# Patient Record
Sex: Female | Born: 1975
Health system: Southern US, Community
[De-identification: ages and names within clinical notes are randomized; demographics above are authoritative.]

## PROBLEM LIST (undated history)

## (undated) ENCOUNTER — Inpatient Hospital Stay (HOSPITAL_COMMUNITY): Payer: Self-pay

## (undated) DIAGNOSIS — O24419 Gestational diabetes mellitus in pregnancy, unspecified control: Secondary | ICD-10-CM

## (undated) DIAGNOSIS — I10 Essential (primary) hypertension: Secondary | ICD-10-CM

## (undated) HISTORY — PX: DILATION AND CURETTAGE OF UTERUS: SHX78

---

## 2002-03-28 ENCOUNTER — Encounter: Payer: Self-pay | Admitting: Emergency Medicine

## 2002-03-28 ENCOUNTER — Emergency Department (HOSPITAL_COMMUNITY): Admission: EM | Admit: 2002-03-28 | Discharge: 2002-03-28 | Payer: Self-pay | Admitting: Emergency Medicine

## 2002-03-29 ENCOUNTER — Emergency Department (HOSPITAL_COMMUNITY): Admission: EM | Admit: 2002-03-29 | Discharge: 2002-03-29 | Payer: Self-pay

## 2002-12-17 ENCOUNTER — Ambulatory Visit (HOSPITAL_COMMUNITY): Admission: RE | Admit: 2002-12-17 | Discharge: 2002-12-17 | Payer: Self-pay | Admitting: Internal Medicine

## 2002-12-17 ENCOUNTER — Encounter: Payer: Self-pay | Admitting: Internal Medicine

## 2003-09-21 ENCOUNTER — Other Ambulatory Visit: Admission: RE | Admit: 2003-09-21 | Discharge: 2003-09-21 | Payer: Self-pay | Admitting: Obstetrics and Gynecology

## 2003-09-22 ENCOUNTER — Inpatient Hospital Stay (HOSPITAL_COMMUNITY): Admission: AD | Admit: 2003-09-22 | Discharge: 2003-09-22 | Payer: Self-pay | Admitting: *Deleted

## 2003-09-24 ENCOUNTER — Encounter (INDEPENDENT_AMBULATORY_CARE_PROVIDER_SITE_OTHER): Payer: Self-pay | Admitting: *Deleted

## 2003-09-24 ENCOUNTER — Ambulatory Visit (HOSPITAL_COMMUNITY): Admission: RE | Admit: 2003-09-24 | Discharge: 2003-09-24 | Payer: Self-pay | Admitting: Obstetrics & Gynecology

## 2003-10-13 ENCOUNTER — Encounter: Admission: RE | Admit: 2003-10-13 | Discharge: 2003-10-13 | Payer: Self-pay | Admitting: Obstetrics & Gynecology

## 2004-04-19 ENCOUNTER — Encounter: Admission: RE | Admit: 2004-04-19 | Discharge: 2004-04-19 | Payer: Self-pay | Admitting: Obstetrics and Gynecology

## 2005-08-22 ENCOUNTER — Inpatient Hospital Stay (HOSPITAL_COMMUNITY): Admission: AD | Admit: 2005-08-22 | Discharge: 2005-08-25 | Payer: Self-pay | Admitting: Obstetrics & Gynecology

## 2007-06-19 ENCOUNTER — Inpatient Hospital Stay (HOSPITAL_COMMUNITY): Admission: AD | Admit: 2007-06-19 | Discharge: 2007-06-22 | Payer: Self-pay | Admitting: Obstetrics

## 2008-01-30 LAB — HEMOGLOBINOPATHY EVALUATION: Hemoglobinopathy Profile: NORMAL

## 2010-11-27 ENCOUNTER — Encounter: Payer: Self-pay | Admitting: Obstetrics

## 2011-03-24 NOTE — Op Note (Signed)
NAME:  Alison Webb, Alison Webb                    ACCOUNT NO.:  [id number]   MEDICAL RECORD NO.:  [id number]                   PATIENT TYPE:  AMB   LOCATION:  SDC                                  FACILITY:  [facility]   PHYSICIAN:  Lesly Dukes, M.D.              DATE OF BIRTH:  [birth year]   DATE OF PROCEDURE:  09/24/2003  DATE OF DISCHARGE:  09/24/2003                                 OPERATIVE REPORT   PREOPERATIVE DIAGNOSIS:  A 35 year old female with a missed abortion at  approximately eight weeks.   POSTOPERATIVE DIAGNOSIS:  A 35 year old female with a missed abortion at  approximately eight weeks.   PROCEDURE:  Dilation and vacuum curettage.   ANESTHESIA:  MAC and local.   SURGEON:  Lesly Dukes, M.D.   ESTIMATED BLOOD LOSS:  50 mL.   COMPLICATIONS:  None.   PATHOLOGY:  Endometrial curettings.   DESCRIPTION OF PROCEDURE:  After informed consent was obtained, the patient  was taken to the operating room, where MAC anesthesia was found to be  adequate.  The patient was in the dorsal lithotomy position and prepared and  draped in the normal sinus rhythm.  The patient's bladder was in-and-out  catheterized and an exam under anesthesia was performed.  No additional  findings were noted.  A weighted speculum was placed in the patient's vagina  and the anterior lip of the cervix was grasped with a single-tooth  tenaculum.  Subsequently 5 mL of 1% lidocaine was injected at 3 and 9  o'clock.  The uterus was then sounded to 11 cm.  The cervix was then gently  dilated with the Hegar dilators to a #8.  A #8 curved suction curette was  gently introduced into the uterus and a gentle suction curettage was  performed.  The suction curette was removed and gentle sharp curettage was  performed.  One final pass with the suction curette was then performed.  Good hemostasis was noted from the cervical os and the tenaculum site.  The  patient tolerated the procedure well.  Sponge, lap,  instrument, and needle  count were correct x2 and the patient went to the recovery room in stable  condition.                                               Lesly Dukes, M.D.    Lora Paula  D:  09/28/2003  T:  09/29/2003  Job:  [id number]

## 2011-03-24 NOTE — Group Therapy Note (Signed)
NAME:  Alison Webb, Alison Webb NO.:  [id number]   MEDICAL RECORD NO.:  [id number]                   PATIENT TYPE:  OUT   LOCATION:  [facility]                           FACILITY:  [facility]   PHYSICIAN:  Argentina Donovan, MD                     DATE OF BIRTH:  Dec 30, 1975   DATE OF SERVICE:                                    CLINIC NOTE   This is a 35 year old, gravida 1, para 0-0-1-0, who had a spontaneous  abortion and D&C in November 2004.  She has always had a history of  irregular periods and has, for the last six months, had only a couple of  periods of where she just spotted.  The physical examination is normal and  Pap smear was done.   She probably has polycystic ovarian syndrome.  We will order an FSH, LH,  TSH, hemoglobin A1c and a prolactin level and also a beta HCG.  If all of  these are normal, I would try the patient on Glucophage 500 t.i.d. and see  if we could not get her ovulating regularly and perhaps pregnant.   IMPRESSION:  __________ menorrhea, probable polycystic ovarian syndrome.                                               Argentina Donovan, MD    PR/MEDQ  D:  04/19/2004  T:  04/20/2004  Job:  [id number]

## 2011-08-18 LAB — CCBB MATERNAL DONOR DRAW

## 2011-08-18 LAB — CBC
HCT: 33.1 — ABNORMAL LOW
Hemoglobin: 11.6 — ABNORMAL LOW
MCHC: 35.1
MCV: 91.3
Platelets: 153
RBC: 3.62 — ABNORMAL LOW
RDW: 15.1 — ABNORMAL HIGH
WBC: 8.2

## 2011-08-21 LAB — CBC
MCHC: 34.2
Platelets: 183
RBC: 4.17
WBC: 6.4

## 2011-08-21 LAB — RPR: RPR Ser Ql: NONREACTIVE

## 2013-01-06 LAB — CYTOLOGY - PAP: Pap: NEGATIVE

## 2013-09-04 ENCOUNTER — Ambulatory Visit: Payer: Self-pay | Admitting: Family Medicine

## 2013-09-04 VITALS — BP 120/86 | HR 86 | Temp 99.2°F | Resp 18 | Ht 60.25 in | Wt 165.4 lb

## 2013-09-04 DIAGNOSIS — R1032 Left lower quadrant pain: Secondary | ICD-10-CM

## 2013-09-04 DIAGNOSIS — N921 Excessive and frequent menstruation with irregular cycle: Secondary | ICD-10-CM

## 2013-09-04 LAB — POCT UA - MICROSCOPIC ONLY
Casts, Ur, LPF, POC: NEGATIVE
Crystals, Ur, HPF, POC: NEGATIVE
Mucus, UA: NEGATIVE
Yeast, UA: NEGATIVE

## 2013-09-04 LAB — POCT WET PREP WITH KOH
Clue Cells Wet Prep HPF POC: NEGATIVE
KOH Prep POC: NEGATIVE
Trichomonas, UA: NEGATIVE
Yeast Wet Prep HPF POC: NEGATIVE

## 2013-09-04 LAB — POCT URINALYSIS DIPSTICK
Bilirubin, UA: NEGATIVE
Glucose, UA: NEGATIVE
Ketones, UA: NEGATIVE
Leukocytes, UA: NEGATIVE
Nitrite, UA: NEGATIVE
Protein, UA: NEGATIVE
Spec Grav, UA: 1.015
Urobilinogen, UA: 0.2
pH, UA: 7

## 2013-09-04 LAB — POCT URINE PREGNANCY: Preg Test, Ur: NEGATIVE

## 2013-09-04 NOTE — Patient Instructions (Signed)
 Metrorragia  (Metrorrhagia)  La metrorragia es el sangrado que proviene del tero. Aparece en forma inesperada, como por ejemplo entre los [employer]. El sangrado puede durar [place]. CUIDADOS EN EL HOGAR   Tome los medicamentos como  indic el mdico. No modifique los medicamentos sin consultarlo con el mdico.  Tome los comprimidos de hierro (supmentos) exactamente como  indic el mdico. Si tiene dificultad para ir de cuerpo (mover el intestino),consuma ms frutas y verduras.  No tome aspirina antes o durante el perodo menstrual. No tome medicamentos que contengan aspirina. Contro las etiquetas.  Descanse todo lo que pueda si debe cambiar el apsito o el tampn ms de una vez cada 2 horas.  Consuma alimentos que contengan hierro. Estos alimentos son:  [employer].  Carnes rojas.  Hgado.  Huevos.  Panes y cereas integras.  No trate de perder peso hasta que su mdico la autorice. SOLICITE AYUDA DE INMEDIATO SI:   Tiene fiebre.  Siente escalofros.  Se siente dbil, tiene mareos o se desvanece (se desmaya).  Debe cambiar el apsito o el tampn ms de una vez por hora.  Tiene un sangrado abundante.  Elimina cogulos de sangre por la vagina.  Siente Programme researcher, broadcasting/film/video (nuseas) y vmitos.  No puede retener los alimentos.  Se siente mareada o tiene heces acuosas (diarrea) mientras toma los medicamentos.  Tiene probmas que usted cree que estn relacionados con los medicamentos. ASEGRESE DE QUE:   Comprende estas instrucciones.  Controlar su enfermedad.  Solicitar ayuda de inmediato si no mejora o si empeora. Document Reased: 04/23/2012 [facility] Patient Information 2014 [place], [place]. Metrorrhagia Metrorrhagia is beding from your uterus. It happens at times when you are not expecting it, like between periods. The beding may also last a long time. HOME CARE   Take all medicine as told by your doctor. Do not  change or switch medicines without talking to your doctor first.  Take iron pills (suppments) as told by your doctor. If you start to have troub pooping (bowel movement), eat more fruits and vegetabs.  Do not take aspirin before or during your period. Do not take medicines that have aspirin in them. Check the label.  Rest as much as you can if you change a pad or tampon more than once in 2 hours.  Eat meals that include foods that have iron in them. These foods are:  Green afy vegetabs.  Red meat.  Liver.  Eggs.  Who-grain breads and cereals.  Do not try to lose weight until your doctor says it is okay. GET HELP RIGHT AWAY IF:  You have a fever.  You have chills.  You feel lightheaded or pass out (faint).  You need to change your pad or tampon more than once in 1 hour.  Your beding is heavy.  You pass clumps of blood (clots) or tissue from your vagina.  You feel sick to your stomach (nauseous) and throw up (vomit).  You cannot keep foods down.  You feel dizzy or have watery poop (diarrhea) whi taking medicine.  You have probms that you think are caused by your medicines. MAKE SURE YOU:   Understand these instructions.  Will watch your condition.  Will get help right away if you are not doing well or get worse. Document Reased: 01/15/2012 Document Reviewed: 01/15/2012 [facility] Patient Information 2014 [place], [place].

## 2013-09-04 NOTE — Progress Notes (Signed)
[facility]:  Office Visit  Chief Complaint:  Chief Complaint  Patient presents with  . Abdominal Pain    pt states her menstrual cycle has came on 2 x in one month, feeling some discomfort in lower left abdominal pelvic region  . Back Pain    HPI: Alison Webb is a 37 y.o. female, pt states she had a menstrual period  around the 16th of October, and started bleeding again on Monday. Eac time lasted for 4-5 days. When the bleeding started on Monday it was heavy and then tapered off to spotting after a day or 2. Pt reports that it stopped today.She started birth control pills 2 years ago. NO new changes to OCP.  She is taking birth control, and states her periods have always been very regular. She is having low back pain, she states it is constant and a sharp/achey pain. She is also having LLQ pain that comes and goes. She is unsure whether she should continue with her birth control.  She states that the first 2 days was heavy like her normal period and then she had spotting.  She missed taking her OCP on Saturday and Sunday 2 pills and started taking her pills again Monday night She had sex during the 2 days that she missed the pills without protection She started her period on Monday and today she has no spotting [facility] for [facility]gyn care-- Last pap was in May 2014 was normal No fevers chills nausea dizziness fatigue Mom had a hysterectomy due to uterine tumor ( benign) sounds like fibroids.  No urinary sxs, no STD, no vaginal dc, no ovarian cysts  History reviewed. No pertinent past medical history. History reviewed. No pertinent past surgical history. History   Social History  . Marital Status: Married    Spouse Name: N/A    Number of Children: N/A  . Years of Education: N/A   Social History Main Topics  . Smoking status: Never Smoker   . Smokeless tobacco: None  . Alcohol Use: No  . Drug Use: No  . Sexual Activity: None   Other Topics Concern   . None   Social History Narrative  . None   Family History  Problem Relation Age of Onset  . Cancer Mother   . Hyperlipidemia Mother    No Known Allergies Prior to Admission medications   Not on File     ROS: The patient denies fevers, chills, night sweats, unintentional weight loss, chest pain, palpitations, wheezing, dyspnea on exertion, nausea, vomiting,dysuria, hematuria, melena, numbness, weakness, or tingling.  All other systems have been reviewed and were otherwise negative with the exception of those mentioned in the HPI and as above.    PHYSICAL EXAM: Filed Vitals:   09/04/13 1642  BP: 120/86  Pulse: 86  Temp: 99.2 F (37.3 C)  Resp: 18   Filed Vitals:   09/04/13 1642  Height: 5' 0.25" (1.53 m)  Weight: 165 lb 6.4 oz (75.025 kg)   Body mass index is 32.05 kg/(m^2).  General: Alert, no acute distress HEENT:  Normocephalic, atraumatic, oropharynx patent. EOMI, PERRLA Cardiovascular:  Regular rate and rhythm, no rubs murmurs or gallops.  No Carotid bruits, radial pulse intact. No pedal edema.  Respiratory: Clear to auscultation bilaterally.  No wheezes, rales, or rhonchi.  No cyanosis, no use of accessory musculature GI: No organomegaly, abdomen is soft and non-tender, positive bowel sounds.  No masses. Skin: No rashes. Neurologic: Facial musculature symmetric.  Psychiatric: Patient is appropriate throughout our interaction. Lymphatic: No cervical lymphadenopathy Musculoskeletal: Gait intact. GU normal, no bleeding, cervix nl, no CMT, no dc, no CVA tenderness, no adnexal tenderness   LABS: Results for orders placed in visit on 09/04/13  POCT UA - MICROSCOPIC ONLY      Result Value Range   WBC, Ur, HPF, POC 0-2     RBC, urine, microscopic 4-6     Bacteria, U Microscopic small     Mucus, UA neg     Epithelial cells, urine per micros 0-2     Crystals, Ur, HPF, POC neg     Casts, Ur, LPF, POC neg     Yeast, UA neg    POCT URINALYSIS DIPSTICK       Result Value Range   Color, UA yellow     Clarity, UA clear     Glucose, UA neg     Bilirubin, UA neg     Ketones, UA neg     Spec Grav, UA 1.015     Blood, UA small     pH, UA 7.0     Protein, UA neg     Urobilinogen, UA 0.2     Nitrite, UA neg     Leukocytes, UA Negative    POCT URINE PREGNANCY      Result Value Range   Preg Test, Ur Negative    POCT WET PREP WITH KOH      Result Value Range   Trichomonas, UA Negative     Clue Cells Wet Prep HPF POC neg     Epithelial Wet Prep HPF POC 3-6     Yeast Wet Prep HPF POC neg     Bacteria Wet Prep HPF POC 1+     RBC Wet Prep HPF POC 0-2     WBC Wet Prep HPF POC 3-5     KOH Prep POC Negative       EKG/XRAY:   Primary read interpreted by Dr. Conley Rolls at [facility].   ASSESSMENT/PLAN: Encounter Diagnoses  Name Primary?  . Metrorrhagia Yes  . Abdominal pain, left lower quadrant    Most likely related to 2 missed dose s of OCP Cont with OCP and we will see if she has recurrent abnormal uterine bleed, if so then will refer to ob/gyn or get a pelvic US F/u prn  Gross sideeffects, risk and benefits, and alternatives of medications d/w patient. Patient is aware that all medications have potential sideeffects and we are unable to predict every sideeffect or drug-drug interaction that may occur.  Hamilton Capri PHUONG, DO 09/04/2013 5:43 PM

## 2013-12-10 ENCOUNTER — Ambulatory Visit: Payer: Self-pay | Admitting: Physician Assistant

## 2013-12-10 VITALS — BP 146/84 | HR 106 | Temp 99.0°F | Resp 16 | Ht 60.5 in | Wt 169.0 lb

## 2013-12-10 DIAGNOSIS — N898 Other specified noninflammatory disorders of vagina: Secondary | ICD-10-CM

## 2013-12-10 DIAGNOSIS — R350 Frequency of micturition: Secondary | ICD-10-CM

## 2013-12-10 DIAGNOSIS — R300 Dysuria: Secondary | ICD-10-CM

## 2013-12-10 LAB — POCT URINALYSIS DIPSTICK
Bilirubin, UA: NEGATIVE
Glucose, UA: NEGATIVE
Ketones, UA: NEGATIVE
Leukocytes, UA: NEGATIVE
Nitrite, UA: NEGATIVE
Protein, UA: NEGATIVE
Spec Grav, UA: 1.02
Urobilinogen, UA: 0.2
pH, UA: 7

## 2013-12-10 LAB — POCT UA - MICROSCOPIC ONLY
Casts, Ur, LPF, POC: NEGATIVE
Crystals, Ur, HPF, POC: NEGATIVE
Mucus, UA: NEGATIVE
Yeast, UA: NEGATIVE

## 2013-12-10 LAB — POCT WET PREP WITH KOH
Clue Cells Wet Prep HPF POC: NEGATIVE
KOH Prep POC: NEGATIVE
RBC Wet Prep HPF POC: NEGATIVE
Trichomonas, UA: NEGATIVE
Yeast Wet Prep HPF POC: NEGATIVE

## 2013-12-11 NOTE — Progress Notes (Signed)
Subjective:    Patient ID: Alison Webb, female    DOB: [birth year], 38 y.o.   MRN: [id number]  HPI 38 year old female presents for evaluation of dysuria and urinary frequency x 3 weeks.  Also complains of some vaginal pruritis that seems to be getting worse.  She is pregnant - LNMP 09/16/13. [facility] and will schedule 1st OB appointment in the next few days.  Denies any vaginal discharge, bleeding/spotting, abdominal pain, nausea, vomiting, flank pain, fever, or chills.  She is currently on prenatal vitamins. No other medications.  Patient is otherwise doing well with no other concerns today.     Review of Systems  Constitutional: Negative for fever and chills.  Gastrointestinal: Negative for nausea and vomiting.  Genitourinary: Positive for dysuria and frequency. Negative for flank pain and vaginal discharge.  Neurological: Negative for dizziness and headaches.       Objective:   Physical Exam  Constitutional: She is oriented to person, place, and time. She appears well-developed and well-nourished.  HENT:  Head: Normocephalic and atraumatic.  Right Ear: External ear normal.  Left Ear: External ear normal.  Mouth/Throat: Oropharynx is clear and moist.  Eyes: Conjunctivae are normal.  Neck: Normal range of motion.  Cardiovascular: Normal rate, regular rhythm and normal heart sounds.   Pulmonary/Chest: Effort normal and breath sounds normal.  Abdominal: Soft. Bowel sounds are normal. There is no tenderness. There is no rebound and no guarding.  Genitourinary: Vagina normal and uterus normal. Pelvic exam was performed with patient supine. Cervix exhibits no motion tenderness, no discharge and no friability.  Neurological: She is alert and oriented to person, place, and time.  Psychiatric: She has a normal mood and affect. Her behavior is normal. Judgment and thought content normal.      Results for orders placed in visit on 12/10/13  POCT  URINALYSIS DIPSTICK      Result Value Range   Color, UA yellow     Clarity, UA cloudy     Glucose, UA neg     Bilirubin, UA neg     Ketones, UA neg     Spec Grav, UA 1.020     Blood, UA trace     pH, UA 7.0     Protein, UA neg     Urobilinogen, UA 0.2     Nitrite, UA neg     Leukocytes, UA Negative    POCT UA - MICROSCOPIC ONLY      Result Value Range   WBC, Ur, HPF, POC 0-2     RBC, urine, microscopic 0-2     Bacteria, U Microscopic small     Mucus, UA neg     Epithelial cells, urine per micros 0-2     Crystals, Ur, HPF, POC neg     Casts, Ur, LPF, POC neg     Yeast, UA neg    POCT WET PREP WITH KOH      Result Value Range   Trichomonas, UA Negative     Clue Cells Wet Prep HPF POC neg     Epithelial Wet Prep HPF POC 5-8     Yeast Wet Prep HPF POC neg     Bacteria Wet Prep HPF POC 1+     RBC Wet Prep HPF POC neg     WBC Wet Prep HPF POC 6-10     KOH Prep POC Negative         Assessment & Plan:  Dysuria - Plan: POCT urinalysis dipstick, POCT UA - Microscopic Only, Urine culture  Urinary frequency  Leukorrhea, not specified as infective - Plan: POCT Wet Prep with KOH  Will send urine culture.  UA and wet prep WNL so will reserve treatment for if culture shows growth.   Make OB appointment as soon as possible  Continue prenatal vitamins RTC precautions discussed. [facility]

## 2013-12-12 LAB — URINE CULTURE
Colony Count: NO GROWTH
Organism ID, Bacteria: NO GROWTH

## 2014-01-08 ENCOUNTER — Other Ambulatory Visit (HOSPITAL_COMMUNITY): Payer: Self-pay | Admitting: Nurse Practitioner

## 2014-01-08 ENCOUNTER — Ambulatory Visit (HOSPITAL_COMMUNITY)
Admission: RE | Admit: 2014-01-08 | Discharge: 2014-01-08 | Disposition: A | Discharge status: Home / Self Care | Payer: Medicaid Other | Source: Ambulatory Visit | Attending: Nurse Practitioner | Admitting: Nurse Practitioner

## 2014-01-08 DIAGNOSIS — O3680X0 Pregnancy with inconclusive fetal viability, not applicable or unspecified: Secondary | ICD-10-CM

## 2014-01-08 DIAGNOSIS — O368390 Maternal care for abnormalities of the fetal heart rate or rhythm, unspecified trimester, not applicable or unspecified: Secondary | ICD-10-CM | POA: Insufficient documentation

## 2014-01-08 DIAGNOSIS — IMO0002 Reserved for concepts with insufficient information to code with codable children: Secondary | ICD-10-CM

## 2014-01-08 LAB — OB RESULTS CONSOLE HIV ANTIBODY (ROUTINE TESTING): HIV: NONREACTIVE

## 2014-01-08 LAB — OB RESULTS CONSOLE RUBELLA ANTIBODY, IGM: RUBELLA: IMMUNE

## 2014-01-08 LAB — OB RESULTS CONSOLE ANTIBODY SCREEN: Antibody Screen: NEGATIVE

## 2014-01-08 LAB — OB RESULTS CONSOLE RPR: RPR: NONREACTIVE

## 2014-01-08 LAB — OB RESULTS CONSOLE HGB/HCT, BLOOD
HCT: 41 %
HEMOGLOBIN: 13.5 g/dL

## 2014-01-08 LAB — OB RESULTS CONSOLE PLATELET COUNT: Platelets: 181 10*3/uL

## 2014-01-08 LAB — OB RESULTS CONSOLE HEPATITIS B SURFACE ANTIGEN: Hepatitis B Surface Ag: NEGATIVE

## 2014-01-08 LAB — OB RESULTS CONSOLE ABO/RH: RH TYPE: POSITIVE

## 2014-01-08 LAB — GLUCOSE TOLERANCE, 1 HOUR: Glucose, 1 Hour GTT: 215

## 2014-01-08 LAB — CULTURE, OB URINE: URINE CULTURE, OB: NEGATIVE

## 2014-01-08 LAB — OB RESULTS CONSOLE VARICELLA ZOSTER ANTIBODY, IGG: VARICELLA IGG: IMMUNE

## 2014-01-09 ENCOUNTER — Encounter: Payer: Self-pay | Admitting: *Deleted

## 2014-01-12 ENCOUNTER — Encounter: Payer: Self-pay | Admitting: General Practice

## 2014-01-14 ENCOUNTER — Encounter: Payer: Self-pay | Admitting: General Practice

## 2014-01-21 ENCOUNTER — Encounter: Payer: Self-pay | Admitting: *Deleted

## 2014-01-26 ENCOUNTER — Encounter: Payer: Self-pay | Admitting: Obstetrics and Gynecology

## 2014-01-26 ENCOUNTER — Encounter: Payer: Self-pay | Admitting: Family Medicine

## 2014-01-29 LAB — OB RESULTS CONSOLE HEPATITIS B SURFACE ANTIGEN: HEP B S AG: NEGATIVE

## 2014-01-29 LAB — OB RESULTS CONSOLE ABO/RH: RH Type: POSITIVE

## 2014-01-29 LAB — OB RESULTS CONSOLE ANTIBODY SCREEN: Antibody Screen: NEGATIVE

## 2014-01-29 LAB — OB RESULTS CONSOLE GC/CHLAMYDIA
Chlamydia: NEGATIVE
GC PROBE AMP, GENITAL: NEGATIVE

## 2014-01-29 LAB — OB RESULTS CONSOLE RPR: RPR: NONREACTIVE

## 2014-01-29 LAB — OB RESULTS CONSOLE HIV ANTIBODY (ROUTINE TESTING): HIV: NONREACTIVE

## 2014-01-29 LAB — OB RESULTS CONSOLE RUBELLA ANTIBODY, IGM: Rubella: IMMUNE

## 2014-01-30 LAB — PROCEDURE REPORT - SCANNED: PAP SMEAR: NEGATIVE

## 2014-03-26 ENCOUNTER — Encounter: Payer: Self-pay | Admitting: General Practice

## 2014-04-10 ENCOUNTER — Other Ambulatory Visit (HOSPITAL_COMMUNITY): Payer: Self-pay | Admitting: Obstetrics

## 2014-04-10 DIAGNOSIS — O24919 Unspecified diabetes mellitus in pregnancy, unspecified trimester: Secondary | ICD-10-CM

## 2014-04-10 DIAGNOSIS — Z0489 Encounter for examination and observation for other specified reasons: Secondary | ICD-10-CM

## 2014-04-10 DIAGNOSIS — O09529 Supervision of elderly multigravida, unspecified trimester: Secondary | ICD-10-CM

## 2014-04-10 DIAGNOSIS — IMO0002 Reserved for concepts with insufficient information to code with codable children: Secondary | ICD-10-CM

## 2014-04-21 ENCOUNTER — Encounter: Payer: Self-pay | Admitting: General Practice

## 2014-04-30 ENCOUNTER — Ambulatory Visit (HOSPITAL_COMMUNITY)
Admission: RE | Admit: 2014-04-30 | Discharge: 2014-04-30 | Disposition: A | Discharge status: Home / Self Care | Payer: Self-pay | Source: Ambulatory Visit | Attending: Obstetrics | Admitting: Obstetrics

## 2014-04-30 ENCOUNTER — Encounter: Payer: Medicaid Other | Attending: Obstetrics | Admitting: *Deleted

## 2014-04-30 ENCOUNTER — Encounter (HOSPITAL_COMMUNITY): Payer: Self-pay

## 2014-04-30 DIAGNOSIS — O24919 Unspecified diabetes mellitus in pregnancy, unspecified trimester: Secondary | ICD-10-CM

## 2014-04-30 DIAGNOSIS — O09529 Supervision of elderly multigravida, unspecified trimester: Secondary | ICD-10-CM | POA: Insufficient documentation

## 2014-04-30 DIAGNOSIS — IMO0002 Reserved for concepts with insufficient information to code with codable children: Secondary | ICD-10-CM

## 2014-04-30 DIAGNOSIS — O99810 Abnormal glucose complicating pregnancy: Secondary | ICD-10-CM | POA: Insufficient documentation

## 2014-04-30 DIAGNOSIS — Z363 Encounter for antenatal screening for malformations: Secondary | ICD-10-CM | POA: Insufficient documentation

## 2014-04-30 DIAGNOSIS — Z1389 Encounter for screening for other disorder: Secondary | ICD-10-CM | POA: Insufficient documentation

## 2014-04-30 DIAGNOSIS — Z0489 Encounter for examination and observation for other specified reasons: Secondary | ICD-10-CM

## 2014-04-30 DIAGNOSIS — Z713 Dietary counseling and surveillance: Secondary | ICD-10-CM | POA: Insufficient documentation

## 2014-04-30 NOTE — Progress Notes (Signed)
  Patient was seen on 04/30/14 for Gestational Diabetes self-management . The following learning objectives were met by the patient :   States the definition of Gestational Diabetes  States why dietary management is important in controlling blood glucose  Describes the effects of carbohydrates on blood glucose levels  Demonstrates ability to create a balanced meal plan  Demonstrates carbohydrate counting   States when to check blood glucose levels  Demonstrates proper blood glucose monitoring techniques  States the effect of stress and exercise on blood glucose levels  States the importance of limiting caffeine and abstaining from alcohol and smoking  Plan:  Aim for 2 Carb Choices per meal (30 grams) +/- 1 either way for breakfast Aim for 3 Carb Choices per meal (45 grams) +/- 1 either way from lunch and dinner Aim for 1-2 Carbs per snack Begin reading food labels for Total Carbohydrate and sugar grams of foods Consider  increasing your activity level by walking daily as tolerated Begin checking BG before breakfast and 2 hours after first bit of breakfast, lunch and dinner after  as directed by MD  Take medication  as directed by MD  Blood glucose monitor given: Advised patient to obtain Reli-On glucometer, test strips and lancets  Patient instructed to monitor glucose levels: FBS: 60 - <90 2 hour: <120  Patient received the following handouts:  Carbohydrate Counting List  My Plate  Patient will be seen for follow-up as needed.

## 2014-06-13 ENCOUNTER — Observation Stay (HOSPITAL_COMMUNITY)
Admission: AD | Admit: 2014-06-13 | Discharge: 2014-06-14 | Disposition: A | Discharge status: Home / Self Care | Payer: Medicaid Other | Source: Ambulatory Visit | Attending: Obstetrics & Gynecology | Admitting: Obstetrics & Gynecology

## 2014-06-13 ENCOUNTER — Encounter (HOSPITAL_COMMUNITY): Payer: Self-pay | Admitting: *Deleted

## 2014-06-13 ENCOUNTER — Inpatient Hospital Stay (HOSPITAL_COMMUNITY): Payer: Medicaid Other

## 2014-06-13 DIAGNOSIS — Z79899 Other long term (current) drug therapy: Secondary | ICD-10-CM | POA: Insufficient documentation

## 2014-06-13 DIAGNOSIS — O209 Hemorrhage in early pregnancy, unspecified: Principal | ICD-10-CM | POA: Insufficient documentation

## 2014-06-13 DIAGNOSIS — O4693 Antepartum hemorrhage, unspecified, third trimester: Secondary | ICD-10-CM | POA: Diagnosis present

## 2014-06-13 DIAGNOSIS — Z8632 Personal history of gestational diabetes: Secondary | ICD-10-CM | POA: Insufficient documentation

## 2014-06-13 HISTORY — DX: Gestational diabetes mellitus in pregnancy, unspecified control: O24.419

## 2014-06-13 LAB — CBC
HCT: 38.3 % (ref 36.0–46.0)
HEMOGLOBIN: 13.3 g/dL (ref 12.0–15.0)
MCH: 32 pg (ref 26.0–34.0)
MCHC: 34.7 g/dL (ref 30.0–36.0)
MCV: 92.3 fL (ref 78.0–100.0)
Platelets: 169 10*3/uL (ref 150–400)
RBC: 4.15 MIL/uL (ref 3.87–5.11)
RDW: 14.8 % (ref 11.5–15.5)
WBC: 7.6 10*3/uL (ref 4.0–10.5)

## 2014-06-13 LAB — FIBRINOGEN: Fibrinogen: 539 mg/dL — ABNORMAL HIGH (ref 204–475)

## 2014-06-13 LAB — URINALYSIS, ROUTINE W REFLEX MICROSCOPIC
Bilirubin Urine: NEGATIVE
GLUCOSE, UA: NEGATIVE mg/dL
Ketones, ur: 40 mg/dL — AB
Leukocytes, UA: NEGATIVE
Nitrite: NEGATIVE
PH: 6.5 (ref 5.0–8.0)
PROTEIN: NEGATIVE mg/dL
SPECIFIC GRAVITY, URINE: 1.01 (ref 1.005–1.030)
Urobilinogen, UA: 0.2 mg/dL (ref 0.0–1.0)

## 2014-06-13 LAB — ABO/RH: ABO/RH(D): O POS

## 2014-06-13 LAB — TYPE AND SCREEN
ABO/RH(D): O POS
Antibody Screen: NEGATIVE

## 2014-06-13 LAB — GLUCOSE, CAPILLARY
GLUCOSE-CAPILLARY: 113 mg/dL — AB (ref 70–99)
Glucose-Capillary: 117 mg/dL — ABNORMAL HIGH (ref 70–99)

## 2014-06-13 LAB — URINE MICROSCOPIC-ADD ON

## 2014-06-13 MED ORDER — ZOLPIDEM TARTRATE 5 MG PO TABS: 5.0000 mg | ORAL_TABLET | Freq: Every evening | ORAL | Status: DC | PRN

## 2014-06-13 MED ORDER — ACETAMINOPHEN 325 MG PO TABS: 650.0000 mg | ORAL_TABLET | ORAL | Status: DC | PRN

## 2014-06-13 MED ORDER — PRENATAL MULTIVITAMIN CH
1.0000 | ORAL_TABLET | Freq: Every day | ORAL | Status: DC
  Administered 2014-06-13 – 2014-06-14 (×2): 1 via ORAL
  Filled 2014-06-13 (×4): qty 1

## 2014-06-13 MED ORDER — CALCIUM CARBONATE ANTACID 500 MG PO CHEW
2.0000 | CHEWABLE_TABLET | ORAL | Status: DC | PRN
  Filled 2014-06-13: qty 2

## 2014-06-13 MED ORDER — DOCUSATE SODIUM 100 MG PO CAPS
100.0000 mg | ORAL_CAPSULE | Freq: Every day | ORAL | Status: DC
  Filled 2014-06-13 (×4): qty 1

## 2014-06-13 NOTE — H&P (Signed)
Chief Complaint   Patient presents with   .  Vaginal Bleeding    HPI  Alison Webb 38 y.o. 4639w3d Awakened today with blood in her underwear. Was concerned so came to MAU. Denies any contractions other than CSX CorporationBraxton Hicks. In no pain at present.  OB History    Grav  Para  Term  Preterm  Abortions  TAB  SAB  Ect  Mult  Living    4  2  2   0  1  0  1  0  0  2      Past Medical History   Diagnosis  Date   .  Gestational diabetes     Past Surgical History   Procedure  Laterality  Date   .  Dilation and curettage of uterus       2004    Family History   Problem  Relation  Age of Onset   .  Cancer  Mother    .  Hyperlipidemia  Mother     History   Substance Use Topics   .  Smoking status:  Never Smoker   .  Smokeless tobacco:  Not on file   .  Alcohol Use:  No    Allergies: No Known Allergies  Prescriptions prior to admission   Medication  Sig  Dispense  Refill   .  Prenatal Vit-Fe Fumarate-FA (PRENATAL MULTIVITAMIN) TABS tablet  Take 1 tablet by mouth daily at 12 noon.      Review of Systems  Constitutional: Negative for fever.  Gastrointestinal: Negative for nausea, vomiting and abdominal pain.  Genitourinary:  No vaginal discharge.  Vaginal bleeding.  No dysuria.   Physical Exam   Blood pressure 111/59, pulse 76, temperature 98.1 F (36.7 C), temperature source Oral, resp. rate 18, height 5\' 4"  (1.626 m), weight 154 lb 4 oz (69.967 kg), last menstrual period 09/16/2013.  Physical Exam  Nursing note and vitals reviewed.  Constitutional: She is oriented to person, place, and time. She appears well-developed and well-nourished.  HENT:  Head: Normocephalic.  Eyes: EOM are normal.  Neck: Neck supple.  GI: Soft. There is no tenderness.  Genitourinary:  Speculum exam: Vagina - Small amount of thick dark blood Cervix - difficult to visualize Bimanual exam: Cervix int os closed, external os FT, thick and firm Uterus non tender, gravid Chaperone present for  exam.  Musculoskeletal: Normal range of motion.  Neurological: She is alert and oriented to person, place, and time.  Skin: Skin is warm and dry.  Psychiatric: She has a normal mood and affect.  Gc/Chlam done   MDM  Assessment and Plan   Bleeding in third trimester--ddx cervicitis, threatened PTL, marginal abruption IUP @ 7939w3d; RH positive Stable at present Category I FHT   Plan  Admit for observation Check CBC, fibrinogen U/S for growth Pad count

## 2014-06-13 NOTE — MAU Note (Signed)
Pt states here for light pink spotting in panties when she woke up, then saw more light pink blood when wiping post voiding. Denies pain at present.

## 2014-06-13 NOTE — MAU Provider Note (Signed)
  History     CSN: [id number]  Arrival date and time: 06/13/14 [id number]   First Provider Initiated Contact with Patient 06/13/14 1049      Chief Complaint  Patient presents with  . Vaginal Bleeding   HPI Alison Webb 38 y.o. 737w3d   Awakened today with blood in her underwear.  Was concerned so came to MAU.  Denies any contractions other than [employer] CorporationBraxton Hicks.  In no pain at present.  OB History   Grav Para Term Preterm Abortions TAB SAB Ect Mult Living   4 2 2  0 1 0 1 0 0 2      Past Medical History  Diagnosis Date  . Gestational diabetes     Past Surgical History  Procedure Laterality Date  . Dilation and curettage of uterus      2004    Family History  Problem Relation Age of Onset  . Cancer Mother   . Hyperlipidemia Mother     History  Substance Use Topics  . Smoking status: Never Smoker   . Smokeless tobacco: Not on file  . Alcohol Use: No    Allergies: No Known Allergies  Prescriptions prior to admission  Medication Sig Dispense Refill  . Prenatal Vit-Fe Fumarate-FA (PRENATAL MULTIVITAMIN) TABS tablet Take 1 tablet by mouth daily at 12 noon.        Review of Systems  Constitutional: Negative for fever.  Gastrointestinal: Negative for nausea, vomiting and abdominal pain.  Genitourinary:       No vaginal discharge. Vaginal bleeding. No dysuria.   Physical Exam   Blood pressure 111/59, pulse 76, temperature 98.1 F (36.7 C), temperature source Oral, resp. rate 18, height 5\' 4"  (1.626 m), weight 154 lb 4 oz (69.967 kg), last menstrual period 09/16/2013.  Physical Exam  Nursing note and vitals reviewed. Constitutional: She is oriented to person, place, and time. She appears well-developed and well-nourished.  HENT:  Head: Normocephalic.  Eyes: EOM are normal.  Neck: Neck supple.  GI: Soft. There is no tenderness.  Genitourinary:  Speculum exam: Vagina - Small amount of thick dark blood Cervix  - difficult to visualize Bimanual  exam: Cervix int os closed, external os FT, thick and firm Uterus non tender, gravid Chaperone present for exam.  Musculoskeletal: Normal range of motion.  Neurological: She is alert and oriented to person, place, and time.  Skin: Skin is warm and dry.  Psychiatric: She has a normal mood and affect.  Gc/Chlam done  MAU Course  Procedures Ultrasound preliminary report reviewed. Consult with Dr. Tamela OddiJackson-Moore - orders received   MDM   Assessment and Plan  Bleeding in pregnancy at 34 week  Plan Admit for observation  BURLESON,TERRI 06/13/2014, 11:04 AM

## 2014-06-14 DIAGNOSIS — O4693 Antepartum hemorrhage, unspecified, third trimester: Secondary | ICD-10-CM | POA: Diagnosis present

## 2014-06-14 LAB — GLUCOSE, CAPILLARY
Glucose-Capillary: 69 mg/dL — ABNORMAL LOW (ref 70–99)
Glucose-Capillary: 121 mg/dL — ABNORMAL HIGH (ref 70–99)

## 2014-06-14 NOTE — Discharge Instructions (Signed)
Hemorragia vaginal durante el embarazo (tercer trimestre) (Vaginal Bleeding During Pregnancy, Third Trimester)  Durante el embarazo, es comn tener una pequea hemorragia vaginal (manchas). A veces, la hemorragia es normal y no representa un problema, pero en algunas ocasiones es un sntoma de algo grave. Asegrese de decirle a su mdico de inmediato si tiene algn tipo de hemorragia vaginal. CUIDADOS EN EL HOGAR  Controle su afeccin para ver si hay cambios.  Siga las indicaciones de su mdico con respecto al Richmondgrado de actividad que Belprepuede tener.  Si debe hacer reposo en cama:  Es posible que deba quedarse en cama y levantarse nicamente para ir al bao.  Quizs le permitan hacer The PNC Financialalgunas actividades.  Si es necesario, planifique que alguien la ayude.  Marcelino FreestoneEscriba:  La cantidad de toallas higinicas que [place]ausa cada da.  La frecuencia con la que se cambia las toallas higinicas.  Indique que tan empapados (saturados) estn.  No use tampones.  No se haga duchas vaginales.  No tenga relaciones sexuales ni orgasmos hasta que el mdico la autorice.  Siga los consejos de su mdico acerca de levantar objetos pesados, conducir automviles y Education officer, environmentalrealizar actividad fsica.  Si elimina tejido por la vagina, gurdelo para mostrrselo al American Expressmdico.  Tome los medicamentos solamente como se lo haya indicado el mdico.  No tome aspirina, ya que puede causar hemorragias.  Concurra a todas las visitas de control como se lo haya indicado el mdico. SOLICITE AYUDA SI:   Tiene una hemorragia vaginal.  Tiene clicos.  Tiene dolores de [place].  Tiene fiebre que no desaparece despus de Teacher, adult educationtomar medicamentos. SOLICITE AYUDA DE INMEDIATO SI:  Siente clicos muy intensos en la espalda o en el vientre (abdomen).  Tiene escalofros.  Tiene una prdida de lquido por la vagina.  Elimina cogulos grandes o tejido por la vagina.  Tiene ms hemorragia.  Se siente dbil o que va a desvanecerse.  Pierde el  conocimiento (se desmaya).  No siente que el beb se mueva tanto como antes. ASEGRESE DE QUE:  Comprende estas instrucciones.  Controlar su afeccin.  Recibir ayuda de inmediato si no mejora o si empeora. Document Released: 03/09/2014 [facility] Patient Information 2015 [place], [place]. This information is not intended to replace advice given to you by your health care provider. Make sure you discuss any questions you have with your health care provider.

## 2014-06-14 NOTE — Progress Notes (Signed)
Assessment done with interpreter at bedside. All questions answered.

## 2014-06-14 NOTE — Discharge Summary (Signed)
Physician Discharge Summary  Patient ID: Alison Webb MRN: [id number] DOB/AGE: [birth year] 37 y.o.  Admit date: 06/13/2014 Discharge date: 06/14/2014  Admission Diagnoses: Active Problems:   Third trimester bleeding  Discharge Diagnoses:  Active Problems:   Third trimester bleeding   Discharged Condition: stable  Hospital Course: The brown vaginal discharge described at presentation abated; there was minimal spotting at the time of discharge.  An U/S showed appropriate growth, a normal AFI; there no obvious placental abnormalities.  Labs were normal including a fibrinogen > 400.  Prolonged fetal heart rate monitoring was consistent with fetal well-being.  CBGS remained in range.  Consults: None  Significant Diagnostic Studies: see above  Treatments: None  Discharge Exam: Blood pressure 101/57, pulse 68, temperature 98.3 F (36.8 C), temperature source Oral, resp. rate 18, height 5\' 4"  (1.626 m), weight 69.967 kg (154 lb 4 oz), last menstrual period 09/16/2013. General appearance: alert Resp: clear to auscultation bilaterally Cardio: regular rate and rhythm, S1, S2 normal, no murmur, click, rub or gallop GI: soft, NT Extremities: extremities normal, atraumatic, no cyanosis or edema PV loss: none  Disposition: Discharge to home  Discharge Instructions   Discharge activity:    Complete by:  As directed   No strenuous activity     Discharge diet:    Complete by:  As directed   Carb modified     Do not have sex or do anything that might make you have an orgasm    Complete by:  As directed      Fetal Kick Count:  Lie on our left side for one hour after a meal, and count the number of times your baby kicks.  If it is less than 5 times, get up, move around and drink some juice.  Repeat the test 30 minutes later.  If it is still less than 5 kicks in an hour, notify your doctor.    Complete by:  As directed      Notify physician for a general feeling that "something is  not right"    Complete by:  As directed      Notify physician for increase or change in vaginal discharge    Complete by:  As directed      Notify physician for intestinal cramps, with or without diarrhea, sometimes described as "gas pain"    Complete by:  As directed      Notify physician for leaking of fluid    Complete by:  As directed      Notify physician for low, dull backache, unrelieved by heat or Tylenol    Complete by:  As directed      Notify physician for menstrual like cramps    Complete by:  As directed      Notify physician for pelvic pressure    Complete by:  As directed      Notify physician for uterine contractions.  These may be painless and feel like the uterus is tightening or the baby is  "balling up"    Complete by:  As directed      Notify physician for vaginal bleeding    Complete by:  As directed      PRETERM LABOR:  Includes any of the follwing symptoms that occur between 20 - [redacted] weeks gestation.  If these symptoms are not stopped, preterm labor can result in preterm delivery, placing your baby at risk    Complete by:  As directed  Medication List         prenatal multivitamin Tabs tablet  Take 1 tablet by mouth daily at 12 noon.           Follow-up Information   Follow up with Kathreen CosierMARSHALL,BERNARD A, MD On 06/18/2014.   Specialty:  Obstetrics and Gynecology   Contact information:   [address] [place] [id number] [phone]       Signed: Roseanna RainbowJACKSON-MOORE,Odell Choung A 06/14/2014, 11:00 AM

## 2014-06-14 NOTE — Progress Notes (Signed)
Pt. Is stable and ready to be discharged home. All discharge instructions reviewed via interpreter. All questions answered via interpreter. Pt. Wheeled out via wheelchair to husband's car. Work note given to pt.

## 2014-06-16 LAB — GC/CHLAMYDIA PROBE AMP
CT PROBE, AMP APTIMA: NEGATIVE
GC PROBE AMP APTIMA: NEGATIVE

## 2014-06-25 LAB — OB RESULTS CONSOLE GC/CHLAMYDIA
CHLAMYDIA, DNA PROBE: NEGATIVE
GC PROBE AMP, GENITAL: NEGATIVE

## 2014-06-25 LAB — OB RESULTS CONSOLE GBS: GBS: NEGATIVE

## 2014-07-15 ENCOUNTER — Encounter (HOSPITAL_COMMUNITY): Payer: Self-pay | Admitting: *Deleted

## 2014-07-15 ENCOUNTER — Telehealth (HOSPITAL_COMMUNITY): Payer: Self-pay | Admitting: *Deleted

## 2014-07-15 NOTE — Telephone Encounter (Signed)
Preadmission screen [id number] interpreter number

## 2014-07-16 ENCOUNTER — Encounter (HOSPITAL_COMMUNITY): Payer: Self-pay

## 2014-07-16 ENCOUNTER — Inpatient Hospital Stay (HOSPITAL_COMMUNITY)
Admission: RE | Admit: 2014-07-16 | Discharge: 2014-07-20 | DRG: 766 | Disposition: A | Discharge status: Home / Self Care | Payer: Medicaid Other | Source: Ambulatory Visit | Attending: Obstetrics | Admitting: Obstetrics

## 2014-07-16 DIAGNOSIS — O328XX0 Maternal care for other malpresentation of fetus, not applicable or unspecified: Secondary | ICD-10-CM | POA: Diagnosis present

## 2014-07-16 DIAGNOSIS — O99814 Abnormal glucose complicating childbirth: Principal | ICD-10-CM | POA: Diagnosis present

## 2014-07-16 DIAGNOSIS — Z98891 History of uterine scar from previous surgery: Secondary | ICD-10-CM

## 2014-07-16 DIAGNOSIS — Z3490 Encounter for supervision of normal pregnancy, unspecified, unspecified trimester: Secondary | ICD-10-CM

## 2014-07-16 DIAGNOSIS — O99810 Abnormal glucose complicating pregnancy: Secondary | ICD-10-CM | POA: Diagnosis present

## 2014-07-16 LAB — CBC
HEMATOCRIT: 37.5 % (ref 36.0–46.0)
HEMOGLOBIN: 13 g/dL (ref 12.0–15.0)
MCH: 31.9 pg (ref 26.0–34.0)
MCHC: 34.7 g/dL (ref 30.0–36.0)
MCV: 92.1 fL (ref 78.0–100.0)
Platelets: 166 10*3/uL (ref 150–400)
RBC: 4.07 MIL/uL (ref 3.87–5.11)
RDW: 14.7 % (ref 11.5–15.5)
WBC: 7.7 10*3/uL (ref 4.0–10.5)

## 2014-07-16 LAB — TYPE AND SCREEN
ABO/RH(D): O POS
Antibody Screen: NEGATIVE

## 2014-07-16 LAB — GLUCOSE, CAPILLARY: GLUCOSE-CAPILLARY: 84 mg/dL (ref 70–99)

## 2014-07-16 MED ORDER — OXYCODONE-ACETAMINOPHEN 5-325 MG PO TABS: 2.0000 | ORAL_TABLET | ORAL | Status: DC | PRN

## 2014-07-16 MED ORDER — CITRIC ACID-SODIUM CITRATE 334-500 MG/5ML PO SOLN
30.0000 mL | ORAL | Status: DC | PRN
  Filled 2014-07-16: qty 15

## 2014-07-16 MED ORDER — MISOPROSTOL 25 MCG QUARTER TABLET
25.0000 ug | ORAL_TABLET | ORAL | Status: DC | PRN
  Administered 2014-07-16 – 2014-07-17 (×2): 25 ug via VAGINAL
  Filled 2014-07-16 (×2): qty 0.25

## 2014-07-16 MED ORDER — OXYCODONE-ACETAMINOPHEN 5-325 MG PO TABS: 1.0000 | ORAL_TABLET | ORAL | Status: DC | PRN

## 2014-07-16 MED ORDER — BUTORPHANOL TARTRATE 1 MG/ML IJ SOLN
1.0000 mg | INTRAMUSCULAR | Status: DC | PRN
  Administered 2014-07-17: 1 mg via INTRAVENOUS
  Filled 2014-07-16: qty 1

## 2014-07-16 MED ORDER — ACETAMINOPHEN 325 MG PO TABS: 650.0000 mg | ORAL_TABLET | ORAL | Status: DC | PRN

## 2014-07-16 MED ORDER — OXYTOCIN 40 UNITS IN LACTATED RINGERS INFUSION - SIMPLE MED
62.5000 mL/h | INTRAVENOUS | Status: DC
  Filled 2014-07-16: qty 1000

## 2014-07-16 MED ORDER — FLEET ENEMA 7-19 GM/118ML RE ENEM: 1.0000 | ENEMA | RECTAL | Status: DC | PRN

## 2014-07-16 MED ORDER — OXYTOCIN BOLUS FROM INFUSION: 500.0000 mL | INTRAVENOUS | Status: DC

## 2014-07-16 MED ORDER — TERBUTALINE SULFATE 1 MG/ML IJ SOLN: 0.2500 mg | Freq: Once | INTRAMUSCULAR | Status: AC | PRN

## 2014-07-16 MED ORDER — ONDANSETRON HCL 4 MG/2ML IJ SOLN: 4.0000 mg | Freq: Four times a day (QID) | INTRAMUSCULAR | Status: DC | PRN

## 2014-07-16 MED ORDER — LACTATED RINGERS IV SOLN
INTRAVENOUS | Status: DC
  Administered 2014-07-16: 21:00:00 via INTRAVENOUS

## 2014-07-16 MED ORDER — ZOLPIDEM TARTRATE 5 MG PO TABS: 5.0000 mg | ORAL_TABLET | Freq: Every evening | ORAL | Status: DC | PRN

## 2014-07-16 MED ORDER — LACTATED RINGERS IV SOLN: 500.0000 mL | INTRAVENOUS | Status: DC | PRN

## 2014-07-16 MED ORDER — LIDOCAINE HCL (PF) 1 % IJ SOLN
30.0000 mL | INTRAMUSCULAR | Status: DC | PRN
  Filled 2014-07-16: qty 30

## 2014-07-17 ENCOUNTER — Encounter (HOSPITAL_COMMUNITY): Payer: Medicaid Other | Admitting: Certified Registered"

## 2014-07-17 ENCOUNTER — Encounter (HOSPITAL_COMMUNITY): Payer: Self-pay

## 2014-07-17 ENCOUNTER — Encounter (HOSPITAL_COMMUNITY)
Admission: RE | Disposition: A | Discharge status: Home / Self Care | Payer: Self-pay | Source: Ambulatory Visit | Attending: Obstetrics

## 2014-07-17 ENCOUNTER — Inpatient Hospital Stay (HOSPITAL_COMMUNITY): Payer: Medicaid Other | Admitting: Certified Registered"

## 2014-07-17 DIAGNOSIS — Z98891 History of uterine scar from previous surgery: Secondary | ICD-10-CM

## 2014-07-17 LAB — RPR

## 2014-07-17 LAB — GLUCOSE, CAPILLARY
Glucose-Capillary: 93 mg/dL (ref 70–99)
Glucose-Capillary: 77 mg/dL (ref 70–99)

## 2014-07-17 SURGERY — Surgical Case
Anesthesia: Spinal

## 2014-07-17 MED ORDER — SIMETHICONE 80 MG PO CHEW
80.0000 mg | CHEWABLE_TABLET | Freq: Three times a day (TID) | ORAL | Status: DC
  Administered 2014-07-18 – 2014-07-20 (×7): 80 mg via ORAL
  Filled 2014-07-17 (×8): qty 1

## 2014-07-17 MED ORDER — OXYCODONE-ACETAMINOPHEN 5-325 MG PO TABS: 2.0000 | ORAL_TABLET | ORAL | Status: DC | PRN

## 2014-07-17 MED ORDER — KETOROLAC TROMETHAMINE 30 MG/ML IJ SOLN
INTRAMUSCULAR | Status: AC
  Administered 2014-07-17: 30 mg via INTRAVENOUS
  Filled 2014-07-17: qty 1

## 2014-07-17 MED ORDER — MORPHINE SULFATE 0.5 MG/ML IJ SOLN
INTRAMUSCULAR | Status: AC
  Filled 2014-07-17: qty 10

## 2014-07-17 MED ORDER — KETOROLAC TROMETHAMINE 30 MG/ML IJ SOLN
15.0000 mg | Freq: Once | INTRAMUSCULAR | Status: AC | PRN
  Administered 2014-07-17: 30 mg via INTRAVENOUS

## 2014-07-17 MED ORDER — DIPHENHYDRAMINE HCL 25 MG PO CAPS: 25.0000 mg | ORAL_CAPSULE | Freq: Four times a day (QID) | ORAL | Status: DC | PRN

## 2014-07-17 MED ORDER — LACTATED RINGERS IV BOLUS (SEPSIS)
500.0000 mL | Freq: Once | INTRAVENOUS | Status: AC
  Administered 2014-07-17: 500 mL via INTRAVENOUS

## 2014-07-17 MED ORDER — SODIUM CHLORIDE 0.9 % IJ SOLN
INTRAMUSCULAR | Status: AC
  Filled 2014-07-17: qty 3

## 2014-07-17 MED ORDER — TETANUS-DIPHTH-ACELL PERTUSSIS 5-2.5-18.5 LF-MCG/0.5 IM SUSP
0.5000 mL | Freq: Once | INTRAMUSCULAR | Status: AC
  Administered 2014-07-19: 0.5 mL via INTRAMUSCULAR

## 2014-07-17 MED ORDER — DIBUCAINE 1 % RE OINT: 1.0000 "application " | TOPICAL_OINTMENT | RECTAL | Status: DC | PRN

## 2014-07-17 MED ORDER — IBUPROFEN 600 MG PO TABS
600.0000 mg | ORAL_TABLET | Freq: Four times a day (QID) | ORAL | Status: DC
  Administered 2014-07-18 – 2014-07-20 (×10): 600 mg via ORAL
  Filled 2014-07-17 (×10): qty 1

## 2014-07-17 MED ORDER — OXYTOCIN 40 UNITS IN LACTATED RINGERS INFUSION - SIMPLE MED: 62.5000 mL/h | INTRAVENOUS | Status: AC

## 2014-07-17 MED ORDER — OXYTOCIN 10 UNIT/ML IJ SOLN
INTRAMUSCULAR | Status: AC
  Filled 2014-07-17: qty 4

## 2014-07-17 MED ORDER — ONDANSETRON HCL 4 MG/2ML IJ SOLN
INTRAMUSCULAR | Status: AC
  Filled 2014-07-17: qty 2

## 2014-07-17 MED ORDER — ONDANSETRON HCL 4 MG/2ML IJ SOLN: 4.0000 mg | Freq: Four times a day (QID) | INTRAMUSCULAR | Status: DC | PRN

## 2014-07-17 MED ORDER — OXYCODONE-ACETAMINOPHEN 5-325 MG PO TABS: 1.0000 | ORAL_TABLET | ORAL | Status: DC | PRN

## 2014-07-17 MED ORDER — TERBUTALINE SULFATE 1 MG/ML IJ SOLN: 0.2500 mg | Freq: Once | INTRAMUSCULAR | Status: DC | PRN

## 2014-07-17 MED ORDER — ONDANSETRON HCL 4 MG PO TABS: 4.0000 mg | ORAL_TABLET | ORAL | Status: DC | PRN

## 2014-07-17 MED ORDER — DIPHENHYDRAMINE HCL 12.5 MG/5ML PO ELIX
12.5000 mg | ORAL_SOLUTION | Freq: Four times a day (QID) | ORAL | Status: DC | PRN
  Filled 2014-07-17: qty 5

## 2014-07-17 MED ORDER — WITCH HAZEL-GLYCERIN EX PADS: 1.0000 "application " | MEDICATED_PAD | CUTANEOUS | Status: DC | PRN

## 2014-07-17 MED ORDER — SODIUM CHLORIDE 0.9 % IJ SOLN: 9.0000 mL | INTRAMUSCULAR | Status: DC | PRN

## 2014-07-17 MED ORDER — FENTANYL CITRATE 0.05 MG/ML IJ SOLN: 25.0000 ug | INTRAMUSCULAR | Status: DC | PRN

## 2014-07-17 MED ORDER — ONDANSETRON HCL 4 MG/2ML IJ SOLN
INTRAMUSCULAR | Status: DC | PRN
  Administered 2014-07-17: 4 mg via INTRAVENOUS

## 2014-07-17 MED ORDER — LACTATED RINGERS IV SOLN
INTRAVENOUS | Status: DC | PRN
  Administered 2014-07-17: 14:00:00 via INTRAVENOUS

## 2014-07-17 MED ORDER — OXYTOCIN 40 UNITS IN LACTATED RINGERS INFUSION - SIMPLE MED
1.0000 m[IU]/min | INTRAVENOUS | Status: DC
  Administered 2014-07-17: 2 m[IU]/min via INTRAVENOUS

## 2014-07-17 MED ORDER — OXYTOCIN 40 UNITS IN LACTATED RINGERS INFUSION - SIMPLE MED
INTRAVENOUS | Status: DC | PRN
  Administered 2014-07-17: 40 [IU] via INTRAVENOUS

## 2014-07-17 MED ORDER — MENTHOL 3 MG MT LOZG: 1.0000 | LOZENGE | OROMUCOSAL | Status: DC | PRN

## 2014-07-17 MED ORDER — HYDROMORPHONE 0.3 MG/ML IV SOLN
INTRAVENOUS | Status: DC
  Administered 2014-07-17: 1.8 mg via INTRAVENOUS
  Administered 2014-07-17: 18:00:00 via INTRAVENOUS
  Administered 2014-07-18: 9 mg via INTRAVENOUS
  Administered 2014-07-18: 2 mg via INTRAVENOUS
  Filled 2014-07-17: qty 25

## 2014-07-17 MED ORDER — PRENATAL MULTIVITAMIN CH
1.0000 | ORAL_TABLET | Freq: Every day | ORAL | Status: DC
  Administered 2014-07-18 – 2014-07-19 (×2): 1 via ORAL
  Filled 2014-07-17 (×2): qty 1

## 2014-07-17 MED ORDER — PROMETHAZINE HCL 25 MG/ML IJ SOLN: 6.2500 mg | INTRAMUSCULAR | Status: DC | PRN

## 2014-07-17 MED ORDER — PHENYLEPHRINE HCL 10 MG/ML IJ SOLN
INTRAMUSCULAR | Status: AC
  Filled 2014-07-17: qty 1

## 2014-07-17 MED ORDER — PHENYLEPHRINE HCL 10 MG/ML IJ SOLN
INTRAMUSCULAR | Status: DC | PRN
  Administered 2014-07-17: 40 ug via INTRAVENOUS

## 2014-07-17 MED ORDER — NALOXONE HCL 0.4 MG/ML IJ SOLN: 0.4000 mg | INTRAMUSCULAR | Status: DC | PRN

## 2014-07-17 MED ORDER — FENTANYL CITRATE 0.05 MG/ML IJ SOLN
INTRAMUSCULAR | Status: AC
  Filled 2014-07-17: qty 2

## 2014-07-17 MED ORDER — LANOLIN HYDROUS EX OINT: 1.0000 "application " | TOPICAL_OINTMENT | CUTANEOUS | Status: DC | PRN

## 2014-07-17 MED ORDER — CEFAZOLIN SODIUM-DEXTROSE 2-3 GM-% IV SOLR
INTRAVENOUS | Status: DC | PRN
  Administered 2014-07-17: 2 g via INTRAVENOUS

## 2014-07-17 MED ORDER — PHENYLEPHRINE 8 MG IN D5W 100 ML (0.08MG/ML) PREMIX OPTIME
INJECTION | INTRAVENOUS | Status: DC | PRN
  Administered 2014-07-17: 60 ug/min via INTRAVENOUS

## 2014-07-17 MED ORDER — PHENYLEPHRINE 40 MCG/ML (10ML) SYRINGE FOR IV PUSH (FOR BLOOD PRESSURE SUPPORT)
PREFILLED_SYRINGE | INTRAVENOUS | Status: AC
  Filled 2014-07-17: qty 5

## 2014-07-17 MED ORDER — LACTATED RINGERS IV SOLN
INTRAVENOUS | Status: DC
  Administered 2014-07-18: 03:00:00 via INTRAVENOUS

## 2014-07-17 MED ORDER — SIMETHICONE 80 MG PO CHEW
80.0000 mg | CHEWABLE_TABLET | ORAL | Status: DC
  Administered 2014-07-18 – 2014-07-20 (×3): 80 mg via ORAL
  Filled 2014-07-17 (×3): qty 1

## 2014-07-17 MED ORDER — BUPIVACAINE IN DEXTROSE 0.75-8.25 % IT SOLN
INTRATHECAL | Status: DC | PRN
  Administered 2014-07-17: 1.5 mL via INTRATHECAL

## 2014-07-17 MED ORDER — ONDANSETRON HCL 4 MG/2ML IJ SOLN
4.0000 mg | INTRAMUSCULAR | Status: DC | PRN
Start: 2014-07-17 — End: 2014-07-20

## 2014-07-17 MED ORDER — DIPHENHYDRAMINE HCL 50 MG/ML IJ SOLN: 12.5000 mg | Freq: Four times a day (QID) | INTRAMUSCULAR | Status: DC | PRN

## 2014-07-17 MED ORDER — MIDAZOLAM HCL 2 MG/2ML IJ SOLN: 0.5000 mg | Freq: Once | INTRAMUSCULAR | Status: DC | PRN

## 2014-07-17 MED ORDER — SIMETHICONE 80 MG PO CHEW: 80.0000 mg | CHEWABLE_TABLET | ORAL | Status: DC | PRN

## 2014-07-17 MED ORDER — SENNOSIDES-DOCUSATE SODIUM 8.6-50 MG PO TABS
2.0000 | ORAL_TABLET | ORAL | Status: DC
  Administered 2014-07-18 – 2014-07-20 (×3): 2 via ORAL
  Filled 2014-07-17 (×3): qty 2

## 2014-07-17 MED ORDER — ZOLPIDEM TARTRATE 5 MG PO TABS: 5.0000 mg | ORAL_TABLET | Freq: Every evening | ORAL | Status: DC | PRN

## 2014-07-17 MED ORDER — MEPERIDINE HCL 25 MG/ML IJ SOLN: 6.2500 mg | INTRAMUSCULAR | Status: DC | PRN

## 2014-07-17 MED ORDER — LACTATED RINGERS IV SOLN
INTRAVENOUS | Status: DC | PRN
  Administered 2014-07-17 (×2): via INTRAVENOUS

## 2014-07-17 SURGICAL SUPPLY — 43 items
BLADE SURG 10 STRL SS (BLADE) ×6 IMPLANT
CANISTER WOUND CARE 500ML ATS (WOUND CARE) IMPLANT
CLAMP CORD UMBIL (MISCELLANEOUS) IMPLANT
CLOTH BEACON ORANGE TIMEOUT ST (SAFETY) ×3 IMPLANT
CONTAINER PREFILL 10% NBF 15ML (MISCELLANEOUS) ×6 IMPLANT
DERMABOND ADVANCED (GAUZE/BANDAGES/DRESSINGS)
DERMABOND ADVANCED .7 DNX12 (GAUZE/BANDAGES/DRESSINGS) IMPLANT
DRAPE LG THREE QUARTER DISP (DRAPES) IMPLANT
DRSG OPSITE POSTOP 4X10 (GAUZE/BANDAGES/DRESSINGS) ×6 IMPLANT
DRSG VAC ATS LRG SENSATRAC (GAUZE/BANDAGES/DRESSINGS) IMPLANT
DRSG VAC ATS MED SENSATRAC (GAUZE/BANDAGES/DRESSINGS) IMPLANT
DRSG VAC ATS SM SENSATRAC (GAUZE/BANDAGES/DRESSINGS) IMPLANT
DURAPREP 26ML APPLICATOR (WOUND CARE) IMPLANT
ELECT REM PT RETURN 9FT ADLT (ELECTROSURGICAL) ×3
ELECTRODE REM PT RTRN 9FT ADLT (ELECTROSURGICAL) ×1 IMPLANT
EXTRACTOR VACUUM M CUP 4 TUBE (SUCTIONS) IMPLANT
EXTRACTOR VACUUM M CUP 4' TUBE (SUCTIONS)
GLOVE BIO SURGEON STRL SZ8 (GLOVE) ×6 IMPLANT
GOWN STRL REUS W/TWL LRG LVL3 (GOWN DISPOSABLE) ×6 IMPLANT
KIT ABG SYR 3ML LUER SLIP (SYRINGE) IMPLANT
NEEDLE HYPO 25X5/8 SAFETYGLIDE (NEEDLE) ×3 IMPLANT
NS IRRIG 1000ML POUR BTL (IV SOLUTION) ×3 IMPLANT
PACK C SECTION WH (CUSTOM PROCEDURE TRAY) ×3 IMPLANT
PAD OB MATERNITY 4.3X12.25 (PERSONAL CARE ITEMS) ×3 IMPLANT
RTRCTR C-SECT PINK 25CM LRG (MISCELLANEOUS) IMPLANT
STAPLER VISISTAT 35W (STAPLE) ×3 IMPLANT
SUT CHROMIC 1 CTX 36 (SUTURE) ×9 IMPLANT
SUT GUT PLAIN 0 CT-3 TAN 27 (SUTURE) IMPLANT
SUT MNCRL 0 VIOLET CTX 36 (SUTURE) IMPLANT
SUT MNCRL AB 4-0 PS2 18 (SUTURE) IMPLANT
SUT MON AB 2-0 CT1 27 (SUTURE) ×3 IMPLANT
SUT MON AB 3-0 SH 27 (SUTURE)
SUT MON AB 3-0 SH27 (SUTURE) IMPLANT
SUT MONOCRYL 0 CTX 36 (SUTURE)
SUT PDS AB 0 CTX 60 (SUTURE) IMPLANT
SUT PLAIN 2 0 XLH (SUTURE) IMPLANT
SUT VIC AB 0 CTX 36 (SUTURE)
SUT VIC AB 0 CTX36XBRD ANBCTRL (SUTURE) IMPLANT
SUT VIC AB 2-0 CT1 27 (SUTURE) ×4
SUT VIC AB 2-0 CT1 TAPERPNT 27 (SUTURE) ×2 IMPLANT
TOWEL OR 17X24 6PK STRL BLUE (TOWEL DISPOSABLE) ×3 IMPLANT
TRAY FOLEY CATH 14FR (SET/KITS/TRAYS/PACK) ×3 IMPLANT
WATER STERILE IRR 1000ML POUR (IV SOLUTION) ×3 IMPLANT

## 2014-07-17 NOTE — H&P (Signed)
Alison Webb is a 38 y.o. female presenting for IOL. Maternal Medical History:  Fetal activity: Perceived fetal activity is normal.   Last perceived fetal movement was within the past hour.    Prenatal Complications - Diabetes: gestational.    OB History   Grav Para Term Preterm Abortions TAB SAB Ect Mult Living   0 1 0 1 0 0 2     Past Medical History  Diagnosis Date  . Gestational diabetes     diet controlled   Past Surgical History  Procedure Laterality Date  . Dilation and curettage of uterus      2004   Family History: family history includes Cancer in her mother; Hyperlipidemia in her mother. Social History:  reports that she has never smoked. She does not have any smokeless tobacco history on file. She reports that she does not drink alcohol or use illicit drugs.   Prenatal Transfer Tool  Maternal Diabetes: Yes:  Diabetes Type:  Diet controlled Genetic Screening: Declined Maternal Ultrasounds/Referrals: Normal Fetal Ultrasounds or other Referrals:  Referred to Materal Fetal Medicine  Maternal Substance Abuse:  No Significant Maternal Medications:  None Significant Maternal Lab Results:  None Other Comments:  None  Review of Systems  All other systems reviewed and are negative.   Dilation: 1 Effacement (%): Thick Station: Ballotable Exam by:: ansah-mensah, rnc Blood pressure 113/62, pulse 56, temperature 97.8 F (36.6 C), temperature source Oral, resp. rate 18, height 5' (1.524 m), weight 155 lb (70.308 kg), last menstrual period 09/16/2013. Maternal Exam:  Abdomen: Fetal presentation: vertex  Pelvis: adequate for delivery.   Cervix: Cervix evaluated by digital exam.     Physical Exam  Constitutional: She is oriented to person, place, and time. She appears well-developed and well-nourished.  HENT:  Head: Normocephalic and atraumatic.  Eyes: Conjunctivae are normal. Pupils are equal, round, and reactive to light.  Neck: Normal range of  motion. Neck supple.  Cardiovascular: Normal rate and regular rhythm.   Respiratory: Effort normal.  GI: Soft.  Genitourinary: Vagina normal and uterus normal.  Musculoskeletal: Normal range of motion.  Neurological: She is alert and oriented to person, place, and time.  Skin: Skin is warm and dry.  Psychiatric: She has a normal mood and affect. Her behavior is normal. Judgment and thought content normal.    Prenatal labs: ABO, Rh: --/--/O POS (09/10 2046) Antibody: NEG (09/10 2046) Rubella: Immune (03/26 0000) RPR: NON REAC (09/10 2047)  HBsAg: Negative (03/26 0000)  HIV: Non-reactive (03/26 0000)  GBS: Negative (08/20 0000)   Assessment/Plan: 39 weeks.  GDM.  2 stage IOL.   Tamarcus Condie A 07/17/2014, 5:22 AM

## 2014-07-17 NOTE — Anesthesia Postprocedure Evaluation (Signed)
  Anesthesia Post Note  Patient: Alison Webb  Procedure(s) Performed: Procedure(s) (LRB): CESAREAN SECTION (N/A)  Anesthesia type: Spinal  Patient location: PACU  Post pain: Pain level controlled  Post assessment: Post-op Vital signs reviewed  Last Vitals:  Filed Vitals:   07/17/14 1300  BP: 122/66  Pulse: 70  Temp:   Resp:     Post vital signs: Reviewed  Level of consciousness: awake  Complications: No apparent anesthesia complications

## 2014-07-17 NOTE — H&P (Signed)
This is Dr. Francoise Ceo dictating the history and physical on  Alison Webb  she's a 38 year old gravida 4 para 2012 at 39 weeks and 2 days EDC 916 and she is a diabetic who has been followed by MFM she's in fact with her sugars been normal her blood sugar an admission was 93 negative GBS and cervix fingertip vertex -3 she had Cytotec x2 last night and started on Pitocin this morning Past medical history pregnant diabetic Past surgical history negative Social history negative System review negative Physical well-developed female in no distress HEENT negative Flap lungs clear to P&A Heart regular rhythm no murmurs gallops Breasts negative Abdomen term Pelvic as described above Extremities negative and

## 2014-07-17 NOTE — Anesthesia Preprocedure Evaluation (Signed)
Anesthesia Evaluation  Patient identified by MRN, date of birth, ID band Patient awake    Reviewed: Allergy & Precautions, H&P , NPO status , Patient's Chart, lab work & pertinent test results  Airway Mallampati: III      Dental   Pulmonary  breath sounds clear to auscultation        Cardiovascular Exercise Tolerance: Good Rhythm:regular Rate:Normal     Neuro/Psych    GI/Hepatic   Endo/Other  diabetes  Renal/GU      Musculoskeletal   Abdominal   Peds  Hematology   Anesthesia Other Findings   Reproductive/Obstetrics (+) Pregnancy                           Anesthesia Physical Anesthesia Plan  ASA: III and emergent  Anesthesia Plan: Spinal   Post-op Pain Management:    Induction:   Airway Management Planned:   Additional Equipment:   Intra-op Plan:   Post-operative Plan:   Informed Consent: I have reviewed the patients History and Physical, chart, labs and discussed the procedure including the risks, benefits and alternatives for the proposed anesthesia with the patient or authorized representative who has indicated his/her understanding and acceptance.     Plan Discussed with: Anesthesiologist, CRNA and Surgeon  Anesthesia Plan Comments:         Anesthesia Quick Evaluation

## 2014-07-17 NOTE — Transfer of Care (Signed)
Immediate Anesthesia Transfer of Care Note  Patient: Alison Webb  Procedure(s) Performed: Procedure(s): CESAREAN SECTION (N/A)  Patient Location: PACU  Anesthesia Type:Spinal  Level of Consciousness: awake, alert , oriented and patient cooperative  Airway & Oxygen Therapy: Patient Spontanous Breathing  Post-op Assessment: Report given to PACU RN and Post -op Vital signs reviewed and stable, single bolus of Phenylephrine administered in PACU for hypotension of 80s/40s (pt appropriately responding to questions with no dizziness or mental status changes); pt responsive to Phenylephrine bolus with NIBP of 90s/50s; MDA notified and fluid bolus ordered by MDA and administered by PACU RN.    Post vital signs: Reviewed and stable  Complications: No apparent anesthesia complications

## 2014-07-17 NOTE — Progress Notes (Signed)
Patient ID: Alison Webb, female   DOB: 11-16-1975, 38 y.o.   MRN: [id number] I was called and told that the patient's membranes ruptured and the nurse and and elbow prolapsing in the vagina and a head to the side set was decided she delivered by emergency C-section the patient was taken to the operating room at 1340 spinal in a 1344 and baby out at 1348 and it was a breech presentationfelt

## 2014-07-17 NOTE — Anesthesia Procedure Notes (Signed)
Spinal  Patient location during procedure: OR Start time: 07/17/2014 1:48 PM Staffing Anesthesiologist: Brayton Caves Performed by: anesthesiologist  Preanesthetic Checklist Completed: patient identified, site marked, surgical consent, pre-op evaluation, timeout performed, IV checked, risks and benefits discussed and monitors and equipment checked Spinal Block Patient position: sitting Prep: DuraPrep Patient monitoring: heart rate, cardiac monitor, continuous pulse ox and blood pressure Approach: midline Location: L3-4 Injection technique: single-shot Needle Needle type: Sprotte  Needle gauge: 24 G Needle length: 9 cm Assessment Sensory level: T4 Additional Notes Patient identified.  Risk benefits discussed including failed block, incomplete pain control, headache, nerve damage, paralysis, blood pressure changes, nausea, vomiting, reactions to medication both toxic or allergic, and postpartum back pain.  Patient expressed understanding and wished to proceed.  All questions were answered.  Sterile technique used throughout procedure.  CSF was clear.  No parasthesia or other complications.  Please see nursing notes for vital signs.

## 2014-07-17 NOTE — Op Note (Signed)
Preop diagnosis spontaneous ruptured membranes and then prolapse  Of arm  into the vagina Postop diagnosis  breech Surgeon Dr. Gaynell Face Anesthesia spinal Procedure patient placed on the operating table in the supine position after the spinal administered abdomen prepped and draped bladder  Emptied with a  Foley catheter transverse suprapubic incision made carried down to the rectus fascia fascia cleaned and incised the length of the incision recti muscles retracted laterally peritoneum incised longitudinally transverse incision made in the visceral  Peritoneum  above the bladder bladder mobilized inferiorly transverse low uterine incision made was noted that the presentation of the  [place]  breech and delivered a female Apgar 9 and 9 placenta was fundal removed manually and sent to labor and delivery uterine cavity clean with dry laps uterine incision closed in one layer with continuous one  chromic hemostasis satisfactory bladder flap reattached   uterus well contracted tubes and ovaries normal abdomen  Closed  peritoneum continuous with of 0 chromic fascia continuous with of 0 Dexon and the skin closed with staples blood loss 700 cc patient tolerated procedure well

## 2014-07-18 LAB — CBC
HCT: 31.3 % — ABNORMAL LOW (ref 36.0–46.0)
Hemoglobin: 10.9 g/dL — ABNORMAL LOW (ref 12.0–15.0)
MCH: 32 pg (ref 26.0–34.0)
MCHC: 34.8 g/dL (ref 30.0–36.0)
MCV: 91.8 fL (ref 78.0–100.0)
PLATELETS: 143 10*3/uL — AB (ref 150–400)
RBC: 3.41 MIL/uL — AB (ref 3.87–5.11)
RDW: 14.6 % (ref 11.5–15.5)
WBC: 9.2 10*3/uL (ref 4.0–10.5)

## 2014-07-18 NOTE — Anesthesia Postprocedure Evaluation (Signed)
Anesthesia Post Note  Patient: Alison Webb  Procedure(s) Performed: Procedure(s) (LRB): CESAREAN SECTION (N/A)  Anesthesia type:spinal  Patient location: Mother/Baby  Post pain: Pain level controlled  Post assessment: Post-op Vital signs reviewed  Last Vitals:  Filed Vitals:   07/18/14 0549  BP: 102/71  Pulse: 65  Temp:   Resp:     Post vital signs: Reviewed  Level of consciousness:alert  Complications: No apparent anesthesia complications

## 2014-07-18 NOTE — Progress Notes (Signed)
Interpreter in to go over plan of care for mother and baby. PCA dc'd per md order. Patient told via interpreter to ask for percocet for increased pain . Patient encouraged to ambulate halls and urinate every 2 to 3 hours. Patient encouraged to use incentive spirometer. Patient explained and encouraged to latch infant then supplement on demand. Formula preparation explained via interpreter. LC into help patient and explain latching etc...plan of care. Patient told to feed infant every 3 hours due to being a small baby.

## 2014-07-18 NOTE — Progress Notes (Signed)
I stopped by patient room to check on patients needs.I also order her breakfast for next day. By Orlan Leavens , Spanish Interpreter

## 2014-07-18 NOTE — Progress Notes (Signed)
Interpreter in to ask questions and update patient on plan of care. All questions answered.

## 2014-07-18 NOTE — Progress Notes (Signed)
Patient ID: Alison Webb, female   DOB: 11-02-1976, 38 y.o.   MRN: [id number] Postop day 1 Vital signs normal Incision clean and dry Legs negative doing doing well

## 2014-07-18 NOTE — Plan of Care (Signed)
Problem: Phase II Progression Outcomes Goal: Pain controlled on oral analgesia Outcome: Progressing Interpreter in to explain pain control options to patient.

## 2014-07-19 NOTE — Progress Notes (Signed)
Patient ID: Alison Webb, female   DOB: 29-Aug-1976, 38 y.o.   MRN: [id number] Postop day 2 Vital signs normal Fundus firm Lochia moderate legs negative doing well

## 2014-07-19 NOTE — Progress Notes (Signed)
Interpreter in to go over any teaching or questions the patient had. Patient had questions about breastfeeding and infant cluster feeding. All questions answered for now.

## 2014-07-20 ENCOUNTER — Encounter (HOSPITAL_COMMUNITY): Payer: Self-pay | Admitting: Obstetrics

## 2014-07-20 NOTE — Discharge Instructions (Signed)
Discharge instructions   You can wash your hair  Shower  Eat what you want  Drink what you want  See me in 6 weeks  Your ankles are going to swell more in the next 2 weeks than when pregnant  No sex for 6 weeks   Haly Feher A, MD 07/20/2014

## 2014-07-20 NOTE — Progress Notes (Signed)
Patient ID: Alison Webb, female   DOB: 1976/10/16, 38 y.o.   MRN: [id number] Postop day 3 Vital signs normal Incision clean and dry staples removed and Steri-Strips applied lochia moderate Legs negative home today and

## 2014-07-20 NOTE — Plan of Care (Signed)
Problem: Discharge Progression Outcomes Goal: Remove staples per MD order Outcome: Completed/Met Date Met:  07/20/14 Dr Marshall removed staples and applied steri strips.     

## 2014-07-20 NOTE — Lactation Note (Signed)
This note was copied from the chart of Alison Marge de Alba-Bello. Lactation Consultation Note: Follow up visit with this experienced BF mom. She reports that baby has been latching well and mature milk is starting to come in. LS 9-10 by RN. Mom reports that baby fed about 1 hour ago for 30 minutes. She reports that breast feels softer after nursing. No questions at present. To call prn  Patient Name: Alison Webb [id number] Date: 07/20/2014 Reason for consult: Follow-up assessment;Infant < 6lbs   Maternal Data Formula Feeding for Exclusion: Yes Reason for exclusion: Mother's choice to formula and breast feed on admission  Feeding Feeding Type: Breast Fed  LATCH Score/Interventions                      Lactation Tools Discussed/Used     Consult Status Consult Status: Complete    Pamelia Hoit 07/20/2014, 8:17 AM

## 2014-07-20 NOTE — Progress Notes (Signed)
UR chart review completed.  

## 2014-07-20 NOTE — Progress Notes (Signed)
Stopped by to check on patient's needs. Eda H Royal Interpreter. °

## 2014-07-20 NOTE — Discharge Summary (Signed)
Obstetric Discharge Summary Reason for Admission: induction of labor Prenatal Procedures: none Intrapartum Procedures: cesarean: low cervical, transverse Postpartum Procedures: none Complications-Operative and Postpartum: none Hemoglobin  Date Value Ref Range Status  07/18/2014 10.9* 12.0 - 15.0 g/dL Final  12/16/5619 30.8   Final     HCT  Date Value Ref Range Status  07/18/2014 31.3* 36.0 - 46.0 % Final  01/08/2014 41   Final    Physical Exam:  General: alert Lochia: appropriate Uterine Fundus: firm Incision: healing well DVT Evaluation: No evidence of DVT seen on physical exam.  Discharge Diagnoses: Term Pregnancy-delivered  Discharge Information: Date: 07/20/2014 Activity: pelvic rest Diet: routine Medications: Percocet Condition: stable Instructions: refer to practice specific booklet Discharge to: home Follow-up Information   Follow up with Kathreen Cosier, MD.   Specialty:  Obstetrics and Gynecology   Contact information:   [address] [place] [address] [phone]       Newborn Data: Live born female  Birth Weight: 5 lb 5.2 oz (2415 g) APGAR: 9, 9  Home with mother.  Alison Webb A 07/20/2014, 7:08 AM

## 2014-09-07 ENCOUNTER — Encounter (HOSPITAL_COMMUNITY): Payer: Self-pay | Admitting: Obstetrics

## 2015-01-07 IMAGING — US US OB DETAIL+14 WK
1 series · 12 of 28 positions shown · non-contrast
Comparison: none

[Series 1: us ob detail+14 wk · 0.24mm/px · 12 of 83 slices shown]
[im 4/83]
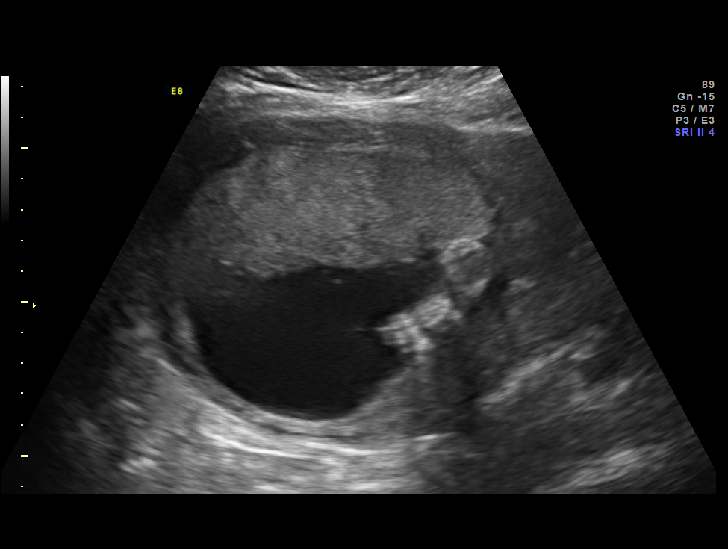
[im 10/83]
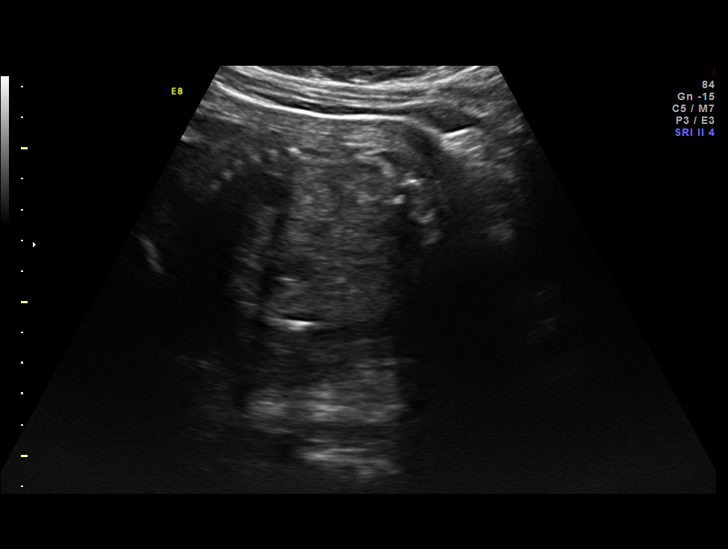
[im 16/83]
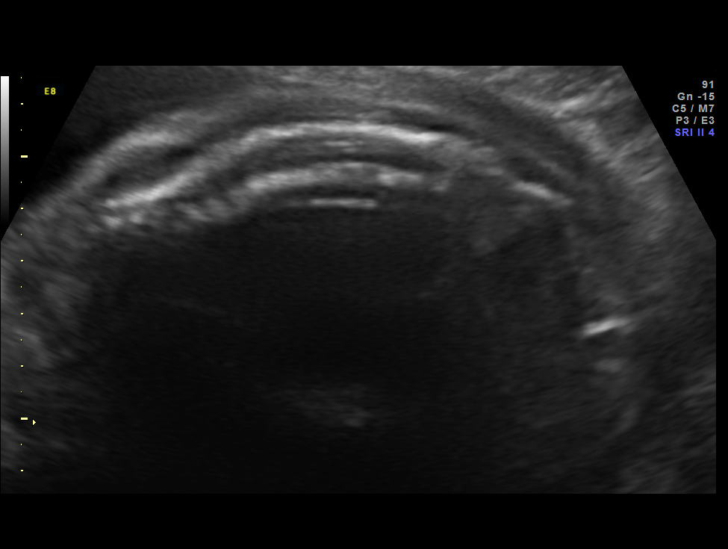
[im 25/83]
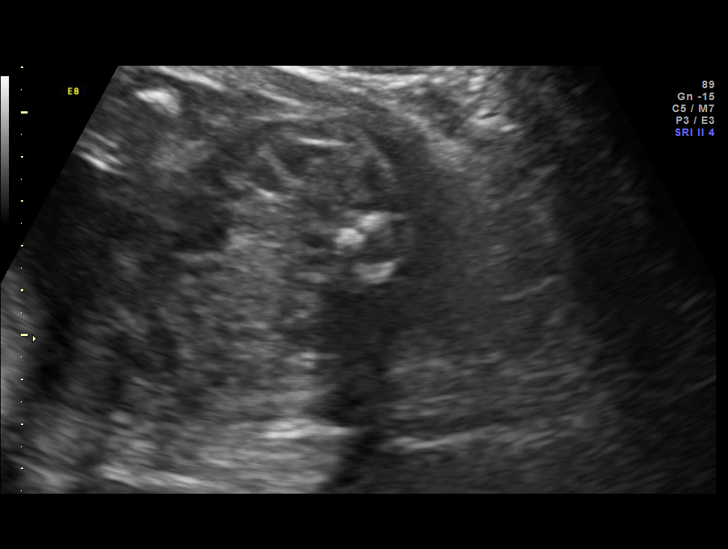
[im 31/83]
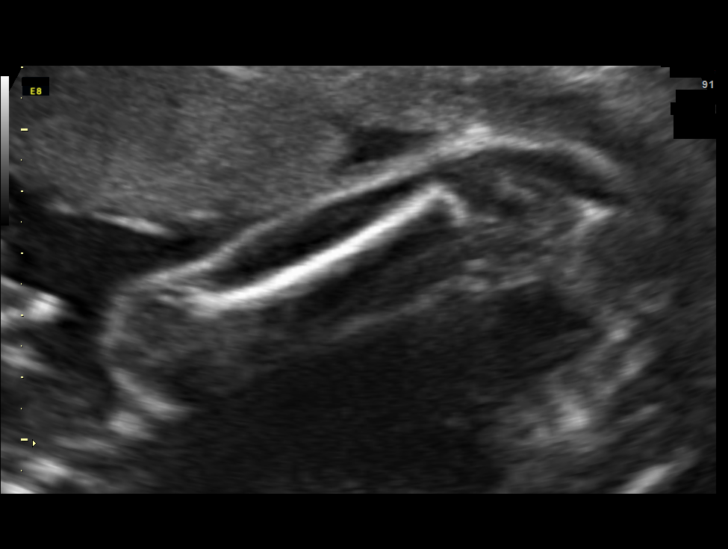
[im 37/83]
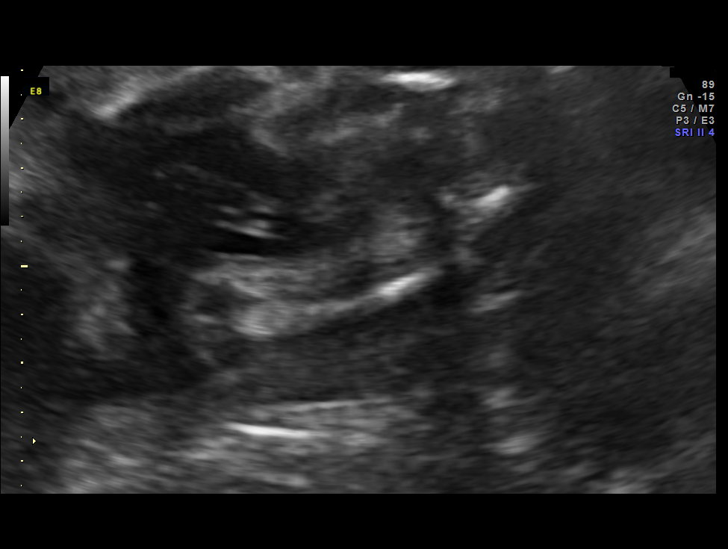
[im 46/83]
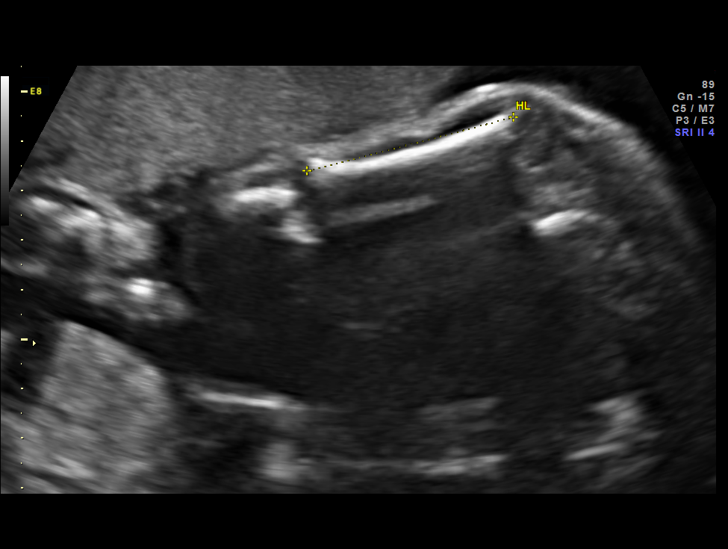
[im 52/83]
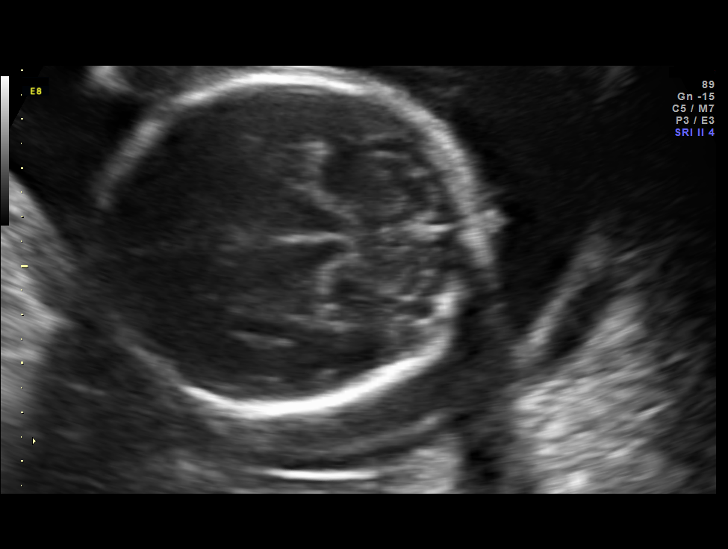
[im 58/83]
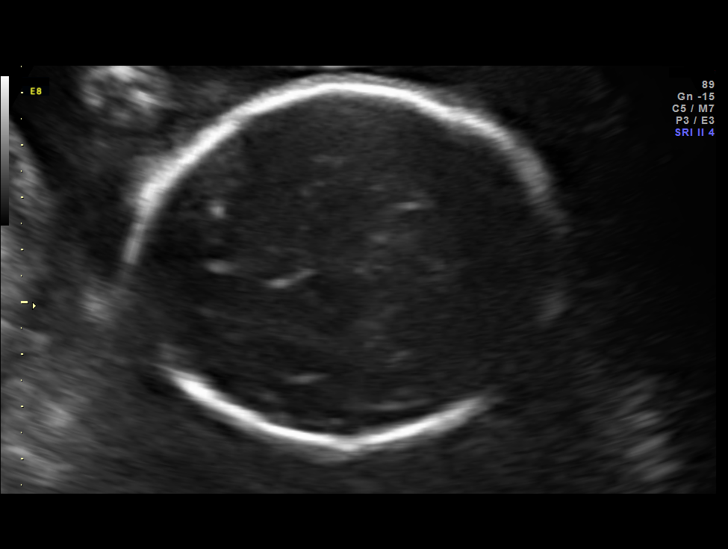
[im 67/83]
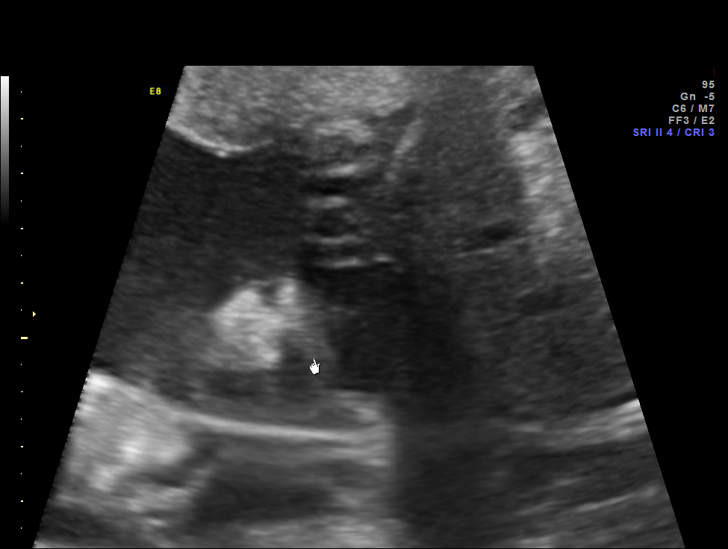
[im 73/83]
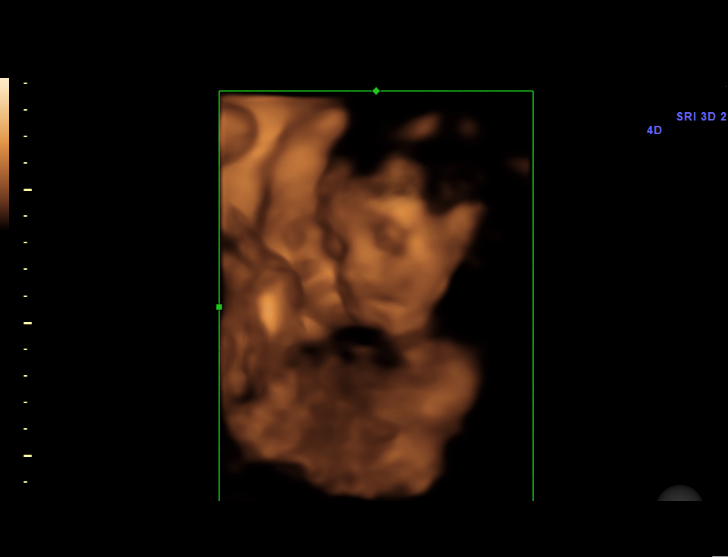
[im 79/83]
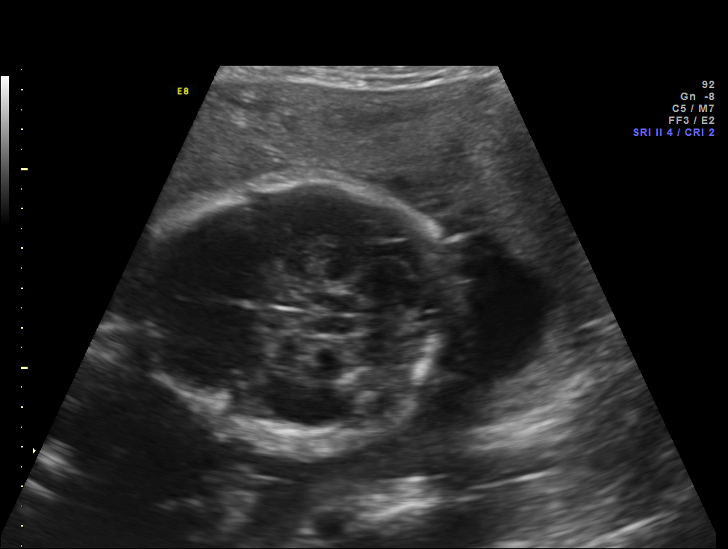

[12 of 28 positions shown; findings below may reference images not displayed]

OBSTETRICS REPORT
                      (Signed Final 04/30/2014 [DATE])

Service(s) Provided

 US OB DETAIL + 14 WK                                  76811.0
Indications

 Detailed fetal anatomic survey
 Advanced maternal age (AMA), Multigravida (37)
 Diabetes - Gestational, A1
Fetal Evaluation

 Num Of Fetuses:    1
 Fetal Heart Rate:  138                          bpm
 Cardiac Activity:  Observed
 Presentation:      Breech
 Placenta:          Anterior, above cervical os
 P. Cord            Visualized
 Insertion:

 Amniotic Fluid
 AFI FV:      Subjectively within normal limits
                                             Larg Pckt:     5.7  cm
Biometry

 BPD:     67.3  mm     G. Age:  27w 1d                CI:         74.9   70 - 86
 OFD:     89.8  mm                                    FL/HC:      18.3   18.8 -

 HC:     252.4  mm     G. Age:  27w 3d        9  %    HC/AC:      1.04   1.05 -

 AC:     241.6  mm     G. Age:  28w 3d       56  %    FL/BPD:     68.6   71 - 87
 FL:      46.2  mm     G. Age:  25w 3d      < 3  %    FL/AC:      19.1   20 - 24
 HUM:     43.1  mm     G. Age:  25w 5d      < 5  %

 Est. FW:    5347  gm      2 lb 5 oz     34  %
Gestational Age

 LMP:           32w 2d        Date:  09/16/13                 EDD:   06/23/14
 U/S Today:     27w 1d                                        EDD:   07/29/14
 Best:          28w 0d     Det. By:  Early Ultrasound         EDD:   07/23/14
                                     (01/08/14)
Anatomy
 Cranium:          Appears normal         Aortic Arch:      Not well visualized
 Fetal Cavum:      Appears normal         Ductal Arch:      Not well visualized
 Ventricles:       Appears normal         Diaphragm:        Appears normal
 Choroid Plexus:   Appears normal         Stomach:          Appears normal, left
                                                            sided
 Cerebellum:       Appears normal         Abdomen:          Appears normal
 Posterior Fossa:  Appears normal         Abdominal Wall:   Appears nml (cord
                                                            insert, abd wall)
 Nuchal Fold:      Not applicable (>20    Cord Vessels:     Appears normal (3
                   wks GA)                                  vessel cord)
 Face:             Appears normal         Kidneys:          Appear normal
                   (orbits and profile)
 Lips:             Appears normal         Bladder:          Appears normal
 Heart:            Appears normal         Spine:            Appears normal
                   (4CH, axis, and
                   situs)
 RVOT:             Appears normal         Lower             Visualized
                                          Extremities:
 LVOT:             Appears normal         Upper             Visualized
                                          Extremities:

 Other:  Fetus appears to be a female.
Cervix Uterus Adnexa

 Cervical Length:    3.1      cm

 Cervix:       Normal appearance by transabdominal scan. Appears
               closed, without funnelling.

 Left Ovary:    Not visualized. No adnexal mass visualized.
 Right Ovary:   Not visualized. No adnexal mass visualized.
Impression

 SIUP at 28+0 weeks
 Normal detailed fetal anatomy; limited views of arches
 Normal amniotic fluid volume
 Measurements consistent with prior US; EFW was at the 34th
 %tile
Recommendations

 Follow-up as clinically indicated

## 2015-03-11 ENCOUNTER — Emergency Department (INDEPENDENT_AMBULATORY_CARE_PROVIDER_SITE_OTHER)
Admission: EM | Admit: 2015-03-11 | Discharge: 2015-03-11 | Disposition: A | Discharge status: Home / Self Care | Payer: Self-pay | Source: Home / Self Care | Attending: Family Medicine | Admitting: Family Medicine

## 2015-03-11 ENCOUNTER — Encounter (HOSPITAL_COMMUNITY): Payer: Self-pay | Admitting: *Deleted

## 2015-03-11 DIAGNOSIS — J020 Streptococcal pharyngitis: Secondary | ICD-10-CM

## 2015-03-11 MED ORDER — AMOXICILLIN 500 MG PO CAPS: 500.0000 mg | ORAL_CAPSULE | Freq: Three times a day (TID) | ORAL | Status: DC

## 2015-03-11 NOTE — ED Notes (Signed)
C/O sore throat since yesterday.

## 2015-03-11 NOTE — ED Provider Notes (Signed)
CSN: [id number]     Arrival date & time 03/11/15  1825 History   First MD Initiated Contact with Patient 03/11/15 1846     No chief complaint on file.  (Consider location/radiation/quality/duration/timing/severity/associated sxs/prior Treatment) Patient is a 39 y.o. female presenting with pharyngitis. The history is provided by the patient.  Sore Throat This is a new problem. The current episode started yesterday. The problem has been gradually worsening. Pertinent negatives include no chest pain and no abdominal pain. Associated symptoms comments: Left earache. The symptoms are aggravated by swallowing.    Past Medical History  Diagnosis Date  . Gestational diabetes     diet controlled   Past Surgical History  Procedure Laterality Date  . Dilation and curettage of uterus      2004  . Cesarean section N/A 07/17/2014    Procedure: CESAREAN SECTION;  Surgeon: Kathreen CosierBernard A Marshall, MD;  Location: [facility];  Service: Obstetrics;  Laterality: N/A;   Family History  Problem Relation Age of Onset  . Cancer Mother   . Hyperlipidemia Mother    History  Substance Use Topics  . Smoking status: Never Smoker   . Smokeless tobacco: Not on file  . Alcohol Use: No   OB History    Gravida Para Term Preterm AB TAB SAB Ectopic Multiple Living   4 3 3  0 1 0 1 0 0 3     Review of Systems  Constitutional: Positive for fever and appetite change.  HENT: Positive for ear pain and sore throat. Negative for congestion.   Respiratory: Negative.   Cardiovascular: Negative.  Negative for chest pain.  Gastrointestinal: Negative.  Negative for abdominal pain.    Allergies  Review of patient's allergies indicates no known allergies.  Home Medications   Prior to Admission medications   Medication Sig Start Date End Date Taking? Authorizing Provider  amoxicillin (AMOXIL) 500 MG capsule Take 1 capsule (500 mg total) by mouth 3 (three) times daily. Start with 2 caps tonight, take all of medication 03/11/15    Linna HoffJames D Britney Newstrom, MD   BP 129/86 mmHg  Pulse 111  Temp(Src) 101.7 F (38.7 C) (Oral)  Resp 12  SpO2 100% Physical Exam  Constitutional: She is oriented to person, place, and time. She appears well-developed and well-nourished.  HENT:  Head: Normocephalic.  Right Ear: External ear normal.  Left Ear: External ear normal.  Mouth/Throat: Oropharynx is clear and moist.  Eyes: Conjunctivae are normal. Pupils are equal, round, and reactive to light.  Neck: Normal range of motion. Neck supple.  Cardiovascular: Normal rate, regular rhythm, normal heart sounds and intact distal pulses.   Pulmonary/Chest: Effort normal and breath sounds normal.  Abdominal: Soft. Bowel sounds are normal. There is no tenderness.  Neurological: She is alert and oriented to person, place, and time.  Skin: Skin is warm and dry.  Nursing note and vitals reviewed.   ED Course  Procedures (including critical care time) Labs Review Labs Reviewed - No data to display  Imaging Review No results found.   MDM   1. Strep sore throat        Linna HoffJames D Ellory Khurana, MD 03/11/15 [phone]

## 2015-03-11 NOTE — Discharge Instructions (Signed)
Drink lots of fluids, take all of medicine, use lozenges as needed.return if needed °

## 2016-05-19 ENCOUNTER — Encounter: Payer: Self-pay | Admitting: Critical Care Medicine

## 2016-05-19 ENCOUNTER — Encounter: Payer: Self-pay | Admitting: Pharmacist

## 2016-05-19 DIAGNOSIS — I10 Essential (primary) hypertension: Secondary | ICD-10-CM

## 2016-05-19 LAB — GLUCOSE, POCT (MANUAL RESULT ENTRY): POC GLUCOSE: 123 mg/dL — AB (ref 70–99)

## 2016-05-19 NOTE — Progress Notes (Signed)
   Subjective:    Patient ID: Alison QuestLaura C de Alba-Bello, female    DOB: 03-16-1976, 40 y.o.   MRN: [id number]  Hypertension This is a new problem. The problem is unchanged. Pertinent negatives include no anxiety, blurred vision, chest pain, headaches, malaise/fatigue, neck pain, orthopnea, palpitations, PND or sweats. There are no associated agents to hypertension. Risk factors for coronary artery disease include obesity. Past treatments include nothing. There is no history of angina, kidney disease, CAD/MI, CVA, heart failure, left ventricular hypertrophy, PVD or a thyroid problem. There is no history of chronic renal disease, coarctation of the aorta or a hypertension causing med.      Review of Systems  Constitutional: Negative for malaise/fatigue.  Eyes: Negative for blurred vision.  Respiratory: Negative for chest tightness and wheezing.   Cardiovascular: Negative for chest pain, palpitations, orthopnea and PND.  Musculoskeletal: Negative for neck pain.  Neurological: Negative for headaches.       Objective:   Physical Exam Filed Vitals:   05/19/16 1154  BP: 165/101  Pulse: 74  Resp: 12    Gen: Pleasant, obese , in no distress,  normal affect  ENT: No lesions,  mouth clear,  oropharynx clear, no postnasal drip  Neck: No JVD, no TMG, no carotid bruits  Lungs: No use of accessory muscles, no dullness to percussion, clear without rales or rhonchi  Cardiovascular: RRR, heart sounds normal, no murmur or gallops, no peripheral edema  Abdomen: soft and NT, no HSM,  BS normal  Musculoskeletal: No deformities, no cyanosis or clubbing  Neuro: alert, non focal  Skin: Warm, no lesions or rashes  No results found.        Assessment & Plan:

## 2016-05-19 NOTE — Progress Notes (Signed)
Patient ID: Alison Webb, female   DOB: 06-Jul-1976, 40 y.o.   MRN: [id number]  [facility] Collaboration with Dr. Shan LevansPatrick Wright Lisinopril-hydrochlorothiazide 10-12.5 mg 1 tablet daily started and dispensed 14 tablets  Patient education provided and patient verbalized understanding. [facility].

## 2016-05-19 NOTE — Assessment & Plan Note (Addendum)
HTN new onset. Needs Rx Plan lisinopril hctz 10/12.5 daily  [facility] Dietary/exercise advise given

## 2016-05-30 ENCOUNTER — Ambulatory Visit: Payer: Self-pay | Admitting: Family Medicine

## 2016-06-06 ENCOUNTER — Ambulatory Visit: Payer: Self-pay | Attending: Internal Medicine

## 2016-08-01 ENCOUNTER — Encounter: Payer: Self-pay | Admitting: *Deleted

## 2016-10-24 ENCOUNTER — Ambulatory Visit: Payer: No Typology Code available for payment source | Admitting: Family Medicine

## 2018-01-04 ENCOUNTER — Ambulatory Visit (HOSPITAL_COMMUNITY)
Admission: EM | Admit: 2018-01-04 | Discharge: 2018-01-04 | Disposition: A | Discharge status: Home / Self Care | Payer: BLUE CROSS/BLUE SHIELD | Attending: Emergency Medicine | Admitting: Emergency Medicine

## 2018-01-04 ENCOUNTER — Other Ambulatory Visit: Payer: Self-pay

## 2018-01-04 ENCOUNTER — Encounter (HOSPITAL_COMMUNITY): Payer: Self-pay | Admitting: Emergency Medicine

## 2018-01-04 DIAGNOSIS — R350 Frequency of micturition: Secondary | ICD-10-CM | POA: Diagnosis present

## 2018-01-04 DIAGNOSIS — R319 Hematuria, unspecified: Secondary | ICD-10-CM | POA: Diagnosis present

## 2018-01-04 DIAGNOSIS — R300 Dysuria: Secondary | ICD-10-CM | POA: Insufficient documentation

## 2018-01-04 DIAGNOSIS — N3001 Acute cystitis with hematuria: Secondary | ICD-10-CM

## 2018-01-04 HISTORY — DX: Essential (primary) hypertension: I10

## 2018-01-04 LAB — POCT URINALYSIS DIP (DEVICE)
BILIRUBIN URINE: NEGATIVE
Glucose, UA: NEGATIVE mg/dL
Ketones, ur: NEGATIVE mg/dL
NITRITE: NEGATIVE
Protein, ur: NEGATIVE mg/dL
Specific Gravity, Urine: <=1.005 (ref 1.005–1.030)
Urobilinogen, UA: 0.2 mg/dL (ref 0.0–1.0)
pH: 5.5 (ref 5.0–8.0)

## 2018-01-04 LAB — POCT PREGNANCY, URINE: Preg Test, Ur: NEGATIVE

## 2018-01-04 MED ORDER — PHENAZOPYRIDINE HCL 200 MG PO TABS: 200.0000 mg | ORAL_TABLET | Freq: Three times a day (TID) | ORAL | 0 refills | Status: DC | PRN

## 2018-01-04 MED ORDER — NITROFURANTOIN MONOHYD MACRO 100 MG PO CAPS: 100.0000 mg | ORAL_CAPSULE | Freq: Two times a day (BID) | ORAL | 0 refills | Status: DC

## 2018-01-04 NOTE — ED Provider Notes (Signed)
HPI  SUBJECTIVE:  Alison Webb is a 42 y.o. female who presents with dysuria, urgency, frequency starting yesterday.  She reports hematuria.  She reports low abdominal pressure after urinating and mild bilateral low back pain.  No cloudy or odorous urine.  No nausea, vomiting, fevers.  No other abdominal, pelvic pain.  No vaginal bleeding, odor, discharge, genital rash, itching.  She is in a long-term monogamous relationship with her husband who is asymptomatic, STDs are not a concern today.  She tried pushing fluids with improvement in her symptoms.  No aggravating factors.  No antibiotics in the past month.  No antipyretic in the past 6-8 hours.  She has a past medical history of hypertension, urinary tract infections.  No history of pyelonephritis, nephrolithiasis, gonorrhea, chlamydia, HIV, HSV, syphilis, Trichomonas, BV, yeast, diabetes.  LMP: Earlier this month, is not sure if she could be pregnant.  PMD: None.  Past Medical History:  Diagnosis Date  . Gestational diabetes    diet controlled  . Hypertension     Past Surgical History:  Procedure Laterality Date  . CESAREAN SECTION N/A 07/17/2014   Procedure: CESAREAN SECTION;  Surgeon: Kathreen Cosier, MD;  Location: [facility];  Service: Obstetrics;  Laterality: N/A;  . DILATION AND CURETTAGE OF UTERUS     2004    Family History  Problem Relation Age of Onset  . Cancer Mother   . Hyperlipidemia Mother     Social History   Tobacco Use  . Smoking status: Never Smoker  Substance Use Topics  . Alcohol use: No    Alcohol/week: 0.0 oz  . Drug use: No    No current facility-administered medications for this encounter.   Current Outpatient Medications:  .  lisinopril-hydrochlorothiazide (PRINZIDE,ZESTORETIC) 10-12.5 MG tablet, Take 1 tablet by mouth daily., Disp: , Rfl:  .  nitrofurantoin, macrocrystal-monohydrate, (MACROBID) 100 MG capsule, Take 1 capsule (100 mg total) by mouth 2 (two) times daily. X 5 days, Disp: 10  capsule, Rfl: 0 .  phenazopyridine (PYRIDIUM) 200 MG tablet, Take 1 tablet (200 mg total) by mouth 3 (three) times daily as needed for pain., Disp: 6 tablet, Rfl: 0  No Known Allergies   ROS  As noted in HPI.   Physical Exam  BP (!) 162/84   Pulse 82   Temp 98 F (36.7 C)   Resp 18   LMP 12/08/2017   SpO2 98%   Constitutional: Well developed, well nourished, no acute distress Eyes:  EOMI, conjunctiva normal bilaterally HENT: Normocephalic, atraumatic,mucus membranes moist Respiratory: Normal inspiratory effort Cardiovascular: Normal rate GI: nondistended.  Normal bowel sounds.  No suprapubic, flank tenderness. Back: No CVAT skin: No rash, skin intact Musculoskeletal: no deformities Neurologic: Alert & oriented x 3, no focal neuro deficits Psychiatric: Speech and behavior appropriate   ED Course   Medications - No data to display  Orders Placed This Encounter  Procedures  . Urine culture    Standing Status:   Standing    Number of Occurrences:   1  . POCT urinalysis dip (device)    Standing Status:   Standing    Number of Occurrences:   1  . Pregnancy, urine POC    Standing Status:   Standing    Number of Occurrences:   1    Results for orders placed or performed during the hospital encounter of 01/04/18 (from the past 24 hour(s))  POCT urinalysis dip (device)     Status: Abnormal   Collection Time:  01/04/18  6:23 PM  Result Value Ref Range   Glucose, UA NEGATIVE NEGATIVE mg/dL   Bilirubin Urine NEGATIVE NEGATIVE   Ketones, ur NEGATIVE NEGATIVE mg/dL   Specific Gravity, Urine <=1.005 1.005 - 1.030   Hgb urine dipstick MODERATE (A) NEGATIVE   pH 5.5 5.0 - 8.0   Protein, ur NEGATIVE NEGATIVE mg/dL   Urobilinogen, UA 0.2 0.0 - 1.0 mg/dL   Nitrite NEGATIVE NEGATIVE   Leukocytes, UA TRACE (A) NEGATIVE  Pregnancy, urine POC     Status: None   Collection Time: 01/04/18  7:19 PM  Result Value Ref Range   Preg Test, Ur NEGATIVE NEGATIVE   No results  found.  ED Clinical Impression  Acute cystitis with hematuria   ED Assessment/Plan  Urine pregnancy negative.  UA with trace leuks, moderate hematuria.  Presentation consist with a UTI.  Will send home with Macrobid, Pyridium, continue pushing fluids.  Will send urine off for culture to confirm  UTI and to confirm antibiotic choice.  Follow-up with a primary care physician of choice, will provide a primary care referral list and order primary care referral as she does need routine care.  To the ER if she gets worse.  Discussed labs, MDM, plan and followup with patient. Discussed sn/sx that should prompt return to the ED. patient agrees with plan.   Meds ordered this encounter  Medications  . nitrofurantoin, macrocrystal-monohydrate, (MACROBID) 100 MG capsule    Sig: Take 1 capsule (100 mg total) by mouth 2 (two) times daily. X 5 days    Dispense:  10 capsule    Refill:  0  . phenazopyridine (PYRIDIUM) 200 MG tablet    Sig: Take 1 tablet (200 mg total) by mouth 3 (three) times daily as needed for pain.    Dispense:  6 tablet    Refill:  0    *This clinic note was created using Scientist, clinical (histocompatibility and immunogenetics)Dragon dictation software. Therefore, there may be occasional mistakes despite careful proofreading.   ?   Domenick GongMortenson, Worthy Boschert, MD 01/04/18 [phone]

## 2018-01-04 NOTE — ED Triage Notes (Signed)
Pt c/o burning feeling when she urinates since yesterday. Pt also states theres some blood in her urine.

## 2018-01-04 NOTE — Discharge Instructions (Signed)
Make sure you drink plenty of fluids.  Finish the antibiotics unless care provider tells you to stop.  Give us a working phone number so that we can change your medications if needed.  Follow-up with a primary care physician of your choice, see list below.  Below is a list of primary care practices who are taking new patients for you to follow-up with. [facility] [address] [place], [place] [address] [phone]  [facility] [phone] [address] [place] [address]  [facility]: [address] [place] [address]  [phone]  [facility]: [address] [place] [address]   [phone]  [facility]: [address] [place] [address]  [phone]  [facility] : [address] [place] [address] [phone]  [facility]: [address] [place] [phone]  [facility]: [address] [place] [address] [phone]  Dr. Oneal GroutMahima Pandey [address] [facility] [address] [facility] [place] [address]  [phone]  Dr. Jackie PlumGeorge Osei-Bonsu, [facility]. [address] [place], [place] [address]  [phone]  Go to www.goodrx.com to look up your medications. This will give you a list of where you can find your prescriptions at the most affordable prices. Or ask the pharmacist what the cash price is, or if they have any other discount programs available to help make your medication more affordable. This can be less expensive than what you would pay with insurance.

## 2018-01-05 LAB — URINE CULTURE: Culture: <10000 — AB

## 2022-10-16 ENCOUNTER — Ambulatory Visit (INDEPENDENT_AMBULATORY_CARE_PROVIDER_SITE_OTHER): Payer: Self-pay

## 2022-10-16 ENCOUNTER — Ambulatory Visit (HOSPITAL_COMMUNITY)
Admission: EM | Admit: 2022-10-16 | Discharge: 2022-10-16 | Disposition: A | Discharge status: Home / Self Care | Payer: Self-pay | Attending: Emergency Medicine | Admitting: Emergency Medicine

## 2022-10-16 ENCOUNTER — Encounter (HOSPITAL_COMMUNITY): Payer: Self-pay

## 2022-10-16 DIAGNOSIS — Z3202 Encounter for pregnancy test, result negative: Secondary | ICD-10-CM

## 2022-10-16 DIAGNOSIS — R059 Cough, unspecified: Secondary | ICD-10-CM

## 2022-10-16 DIAGNOSIS — Z8679 Personal history of other diseases of the circulatory system: Secondary | ICD-10-CM

## 2022-10-16 DIAGNOSIS — R058 Other specified cough: Secondary | ICD-10-CM

## 2022-10-16 LAB — POC URINE PREG, ED: Preg Test, Ur: NEGATIVE

## 2022-10-16 MED ORDER — AMLODIPINE BESYLATE 5 MG PO TABS: 5.0000 mg | ORAL_TABLET | Freq: Every day | ORAL | 0 refills | Status: DC

## 2022-10-16 MED ORDER — PREDNISONE 20 MG PO TABS: 40.0000 mg | ORAL_TABLET | Freq: Every day | ORAL | 0 refills | Status: AC

## 2022-10-16 MED ORDER — BENZONATATE 100 MG PO CAPS: 100.0000 mg | ORAL_CAPSULE | Freq: Three times a day (TID) | ORAL | 0 refills | Status: AC

## 2022-10-16 NOTE — ED Triage Notes (Signed)
Pt c/o cough for over a month. States now having chest congestion. States taking OTC meds with no relief.

## 2022-10-16 NOTE — Discharge Instructions (Addendum)
Tessalon has been sent to the pharmacy for cough, you can use this medication every 8 hours as needed. Prednisone is a steroid, this medication has been sent to the pharmacy, you will take 2 tablets for the next 5 mornings.    Amlodipine has been sent to the pharmacy, take 1 tablet each morning, this is used for high blood pressure.  Please begin monitoring your blood pressure at home and keeping a record each day of the blood pressure.  You need to go ahead and schedule an appointment for follow-up with the provided primary care doctor on your discharge paperwork.   If you begin having any worsening shortness of breath, chest pain, dizziness, lightheadedness, headache, or any new/concerning symptoms please go to the nearest emergency department.

## 2022-10-16 NOTE — ED Provider Notes (Addendum)
[facility]    CSN: [id number] Arrival date & time: 10/16/22  1151      History   Chief Complaint Chief Complaint  Patient presents with   Cough    HPI Alison Webb is a 46 y.o. female.  Patient presents complaining of cough that has been ongoing for 1 month.  Patient reports mostly a nonproductive cough, at times she will experience some yellow sputum production.  Patient reports her son tested positive for COVID approximately 1 month ago.  Patient reports that she never developed any other viral symptoms apart from the cough.  Patient denies any COVID testing when cough started.  She is taking over-the-counter medications with no relief of symptoms.  She denies any history of pneumonia or chronic respiratory illness.  Patient denies any tobacco use.  She reports a history of hypertension.  Patient is currently not taking any medications for hypertension.    Cough Associated symptoms: chest pain (When coughing at times)   Associated symptoms: no chills, no ear pain, no fever, no myalgias, no rhinorrhea, no shortness of breath, no sore throat and no wheezing     Past Medical History:  Diagnosis Date   Gestational diabetes    diet controlled   Hypertension     Patient Active Problem List   Diagnosis Date Noted   HTN (hypertension) 05/19/2016   S/P cesarean section 07/17/2014    Past Surgical History:  Procedure Laterality Date   CESAREAN SECTION N/A 07/17/2014   Procedure: CESAREAN SECTION;  Surgeon: Kathreen Cosier, MD;  Location: [facility];  Service: Obstetrics;  Laterality: N/A;   DILATION AND CURETTAGE OF UTERUS     2004    OB History     Gravida  4   Para  3   Term  3   Preterm  0   AB  1   Living  3      SAB  1   IAB  0   Ectopic  0   Multiple  0   Live Births  3            Home Medications    Prior to Admission medications   Medication Sig Start Date End Date Taking? Authorizing Provider  benzonatate  (TESSALON) 100 MG capsule Take 1 capsule (100 mg total) by mouth every 8 (eight) hours. 10/16/22  Yes Debby Freiberg, NP  amLODipine (NORVASC) 5 MG tablet Take 1 tablet (5 mg total) by mouth daily. 10/16/22  Yes Debby Freiberg, NP  predniSONE (DELTASONE) 20 MG tablet Take 2 tablets (40 mg total) by mouth daily. 10/16/22  Yes Debby Freiberg, NP    Family History Family History  Problem Relation Age of Onset   Cancer Mother    Hyperlipidemia Mother     Social History Social History   Tobacco Use   Smoking status: Never  Substance Use Topics   Alcohol use: No    Alcohol/week: 0.0 standard drinks of alcohol   Drug use: No     Allergies   Patient has no known allergies.   Review of Systems Review of Systems  Constitutional:  Negative for activity change, appetite change, chills, fatigue, fever and unexpected weight change.  HENT: Negative.  Negative for congestion, ear discharge, ear pain, postnasal drip, rhinorrhea, sinus pressure, sinus pain, sneezing, sore throat, trouble swallowing and voice change.   Eyes: Negative.   Respiratory:  Positive for cough. Negative for apnea, choking, chest tightness, shortness of  breath, wheezing and stridor.   Cardiovascular:  Positive for chest pain (When coughing at times). Negative for palpitations and leg swelling.  Gastrointestinal: Negative.   Endocrine: Negative.   Musculoskeletal:  Negative for myalgias.  Allergic/Immunologic: Negative for environmental allergies.  Neurological: Negative.      Physical Exam Triage Vital Signs ED Triage Vitals  Enc Vitals Group     BP 10/16/22 1544 (!) 213/135     Pulse Rate 10/16/22 1544 (!) 105     Resp 10/16/22 1544 18     Temp 10/16/22 1544 99.1 F (37.3 C)     Temp Source 10/16/22 1544 Oral     SpO2 10/16/22 1544 98 %     Weight --      Height --      Head Circumference --      Peak Flow --      Pain Score 10/16/22 1545 0     Pain Loc --      Pain Edu? --      Excl.  in GC? --    No data found.  Updated Vital Signs BP (!) 213/135 (BP Location: Left Arm)   Pulse (!) 105   Temp 99.1 F (37.3 C) (Oral)   Resp 18   LMP 09/09/2022   SpO2 98%   Breastfeeding No      Physical Exam Vitals and nursing note reviewed.  Constitutional:      Appearance: Normal appearance. She is not ill-appearing or diaphoretic.  Cardiovascular:     Rate and Rhythm: Normal rate and regular rhythm.     Heart sounds: Normal heart sounds, S1 normal and S2 normal.  Pulmonary:     Effort: Pulmonary effort is normal.     Breath sounds: Normal breath sounds. No decreased breath sounds, wheezing, rhonchi or rales.  Neurological:     Mental Status: She is alert.      UC Treatments / Results  Labs (all labs ordered are listed, but only abnormal results are displayed) Labs Reviewed  POC URINE PREG, ED    EKG   Radiology DG Chest 2 View  Result Date: 10/16/2022 CLINICAL DATA:  Cough EXAM: CHEST - 2 VIEW COMPARISON:  None Available. FINDINGS: The heart size and mediastinal contours are within normal limits. Both lungs are clear. The visualized skeletal structures are unremarkable. IMPRESSION: No active cardiopulmonary disease. Electronically Signed   By: Duanne Guess D.O.   On: 10/16/2022 16:50    Procedures Procedures (including critical care time)  Medications Ordered in UC Medications - No data to display  Initial Impression / Assessment and Plan / UC Course  I have reviewed the triage vital signs and the nursing notes.  Pertinent labs & imaging results that were available during my care of the patient were reviewed by me and considered in my medical decision making (see chart for details).     Patient was evaluated for cough.  Chest x-ray was performed due to duration of the cough and no awareness of etiology.  Chest x-ray showed no cardiopulmonary disease.  Urine pregnancy ordered due to missed period reported by patient and needing radiologic imaging.   Urine pregnancy was negative. Cough may be due to an initial viral illness that is causing lingering cough and may be triggering an inflammatory response.  Tessalon and prednisone was sent to the pharmacy.  Patient was made aware of treatment regiment and possible side effects.  Patient was made aware to follow-up if symptoms or not improving.  Patient was evaluated for hypertension.  Patient has history of hypertension but states that she has not taken any blood pressure medications in "a long time probably years".  Patient states that she does not follow-up with her PCP due to being uninsured and "feeling good "overall.  Patient was educated on hypertension and second reading in office was 224/130.  Patient was prescribed amlodipine and made aware of treatment regiment and possible side effects.  Patient was made aware that it would be important to start checking blood pressure at home and keeping a blood pressures written down.  This Clinical research associate made patient aware that she will need to follow-up with the PCP, she was given information for a PCP office she can visit. Patient was made aware of red flag symptoms that warrant an emergency department visit. Patient made aware of timeline for symptom resolution and when follow-up would be necessary. Patient verbalized understanding of instructions.    Charting was provided using a a verbal dictation system, charting was proofread for errors, errors may occur which could change the meaning of the information charted.   Final Clinical Impressions(s) / UC Diagnoses   Final diagnoses:  History of hypertension  Post-viral cough syndrome     Discharge Instructions      Kimberlee Nearing has been sent to the pharmacy for cough, you can use this medication every 8 hours as needed. Prednisone is a steroid, this medication has been sent to the pharmacy, you will take 2 tablets for the next 5 mornings.    Amlodipine has been sent to the pharmacy, take 1 tablet each morning,  this is used for high blood pressure.  Please begin monitoring your blood pressure at home and keeping a record each day of the blood pressure.  You need to go ahead and schedule an appointment for follow-up with the provided primary care doctor on your discharge paperwork.   If you begin having any worsening shortness of breath, chest pain, dizziness, lightheadedness, headache, or any new/concerning symptoms please go to the nearest emergency department.      ED Prescriptions     Medication Sig Dispense Auth. Provider   predniSONE (DELTASONE) 20 MG tablet Take 2 tablets (40 mg total) by mouth daily. 10 tablet Debby Freiberg, NP   amLODipine (NORVASC) 5 MG tablet Take 1 tablet (5 mg total) by mouth daily. 30 tablet Debby Freiberg, NP   benzonatate (TESSALON) 100 MG capsule Take 1 capsule (100 mg total) by mouth every 8 (eight) hours. 24 capsule Debby Freiberg, NP      PDMP not reviewed this encounter.   Debby Freiberg, NP 10/16/22 1720    Debby Freiberg, NP 10/16/22 1721    Debby Freiberg, NP 10/16/22 1723

## 2022-12-15 ENCOUNTER — Ambulatory Visit: Payer: Self-pay

## 2022-12-15 NOTE — Telephone Encounter (Signed)
Using Spanish interpreter Garlon Hatchet # [phone], left message to call back     Chief Complaint: HA Symptoms: HA r/t BP elevated, when present 10/10 Frequency: ongoing long time Pertinent Negatives: NA Disposition: []$ ED /[]$ Urgent Care (no appt availability in office) / []$ Appointment(In office/virtual)/ []$  [facility]/ []$ Home Care/ []$ Refused Recommended Disposition /[x]$  Mobile Bus/ []$  Follow-up with PCP Additional Notes: pt states she feel like she is having HA r/t BP elevated since she has been out of amlodipine. Pt has NPA on 01/31/23 and no current PCP. Advised pt she can go to [facility] and be seen 12/21/22. Pt was given location details and agreed she will go there to be seen. No further assistance needed. Care advice given and pt verbalized understanding.         Summary: headaches and is very sleepy / BP Rx refills     Pt stated that some days she is having a lot of headaches and is very sleepy. Pt mentioned that she is scared it may be her blood pressure, as when she went to UC she was advised it was very elevated. Pt stated she needs refills on her BP medication.   Pt is scheduled for new patient appointment at [place] / Wednesday, January 31, 2023 10:00 AM EDT . Provided pt with information to mobile unit as well.    Pt seeking clinical advice.  Reason for Disposition  Headache is a chronic symptom (recurrent or ongoing AND present > 4 weeks)  Answer Assessment - Initial Assessment Questions 1. LOCATION: "Where does it hurt?"       Whole head 2. ONSET: "When did the headache start?" (Minutes, hours or days)      A long time  3. PATTERN: "Does the pain come and go, or has it been constant since it started?"     Comes and goes 4. SEVERITY: "How bad is the pain?" and "What does it keep you from doing?"  (e.g., Scale 1-10; mild, moderate, or severe)   - MILD (1-3): doesn't interfere with normal activities    - MODERATE (4-7): interferes with normal activities or awakens from sleep    - SEVERE (8-10): excruciating pain, unable to do any normal activities        When present 10/10, last weekend  6. CAUSE: "What do you think is causing the headache?"     Unsure if r/t BP elevated 9. OTHER SYMPTOMS: "Do you have any other symptoms?" (fever, stiff neck, eye pain, sore throat, cold symptoms)     Pressure in chest when HA present  Protocols used: Headache-A-AH

## 2022-12-15 NOTE — Telephone Encounter (Signed)
ummary: headaches and is very sleepy / BP Rx refills   Pt stated that some days she is having a lot of headaches and is very sleepy. Pt mentioned that she is scared it may be her blood pressure, as when she went to UC she was advised it was very elevated. Pt stated she needs refills on her BP medication.   Pt is scheduled for new patient appointment at [place] / Wednesday, January 31, 2023 10:00 AM EDT . Provided pt with information to mobile unit as well.    Pt seeking clinical advice.      Using Spanish interpreter # [phone], left message to call back

## 2023-01-31 ENCOUNTER — Ambulatory Visit: Payer: Self-pay | Admitting: Nurse Practitioner

## 2023-06-22 ENCOUNTER — Encounter (HOSPITAL_COMMUNITY): Payer: Self-pay

## 2023-06-22 ENCOUNTER — Ambulatory Visit (INDEPENDENT_AMBULATORY_CARE_PROVIDER_SITE_OTHER): Payer: No Typology Code available for payment source

## 2023-06-22 ENCOUNTER — Ambulatory Visit (HOSPITAL_COMMUNITY)
Admission: EM | Admit: 2023-06-22 | Discharge: 2023-06-22 | Disposition: A | Discharge status: Home / Self Care | Payer: No Typology Code available for payment source | Attending: Emergency Medicine | Admitting: Emergency Medicine

## 2023-06-22 DIAGNOSIS — R059 Cough, unspecified: Secondary | ICD-10-CM

## 2023-06-22 DIAGNOSIS — Z1152 Encounter for screening for COVID-19: Secondary | ICD-10-CM | POA: Insufficient documentation

## 2023-06-22 DIAGNOSIS — J069 Acute upper respiratory infection, unspecified: Secondary | ICD-10-CM | POA: Insufficient documentation

## 2023-06-22 DIAGNOSIS — I10 Essential (primary) hypertension: Secondary | ICD-10-CM | POA: Insufficient documentation

## 2023-06-22 MED ORDER — AMLODIPINE BESYLATE 5 MG PO TABS: 5.0000 mg | ORAL_TABLET | Freq: Every day | ORAL | 0 refills | Status: DC

## 2023-06-22 MED ORDER — PROMETHAZINE-DM 6.25-15 MG/5ML PO SYRP: 5.0000 mL | ORAL_SOLUTION | Freq: Four times a day (QID) | ORAL | 0 refills | Status: AC | PRN

## 2023-06-22 NOTE — ED Provider Notes (Signed)
[facility]    CSN: [id number] Arrival date & time: 06/22/23  4098      History   Chief Complaint Chief Complaint  Patient presents with   Cough   Nasal Congestion    HPI Alison Webb is a 47 y.o. female.   Patient presents to clinic complaining of a cough, mild sore throat and nasal congestion that started last night.  With coughing fits she does feel short of breath and like she is wheezing.  Denies any chest pain.   Denies any recent sick contacts, denies fever, hot or cold chills, body aches or fatigue.  She did take over-the-counter cough medication last night.  She is hypertensive with history of same, no current primary care provider and no insurance.  She denies vision changes or severe headache.   The history is provided by the patient and medical records.  Cough Associated symptoms: rhinorrhea, shortness of breath, sore throat and wheezing   Associated symptoms: no chest pain and no fever     Past Medical History:  Diagnosis Date   Gestational diabetes    diet controlled   Hypertension     Patient Active Problem List   Diagnosis Date Noted   HTN (hypertension) 05/19/2016   S/P cesarean section 07/17/2014    Past Surgical History:  Procedure Laterality Date   CESAREAN SECTION N/A 07/17/2014   Procedure: CESAREAN SECTION;  Surgeon: Kathreen Cosier, MD;  Location: [facility];  Service: Obstetrics;  Laterality: N/A;   DILATION AND CURETTAGE OF UTERUS     2004    OB History     Gravida  4   Para  3   Term  3   Preterm  0   AB  1   Living  3      SAB  1   IAB  0   Ectopic  0   Multiple  0   Live Births  3            Home Medications    Prior to Admission medications   Medication Sig Start Date End Date Taking? Authorizing Provider  promethazine-dextromethorphan (PROMETHAZINE-DM) 6.25-15 MG/5ML syrup Take 5 mLs by mouth 4 (four) times daily as needed for cough. 06/22/23  Yes Rinaldo Ratel, [place] N, FNP   amLODipine (NORVASC) 5 MG tablet Take 1 tablet (5 mg total) by mouth daily. 06/22/23   Jerrion Tabbert, [place] N, FNP  benzonatate (TESSALON) 100 MG capsule Take 1 capsule (100 mg total) by mouth every 8 (eight) hours. 10/16/22   Debby Freiberg, NP  predniSONE (DELTASONE) 20 MG tablet Take 2 tablets (40 mg total) by mouth daily. 10/16/22   Debby Freiberg, NP    Family History Family History  Problem Relation Age of Onset   Cancer Mother    Hyperlipidemia Mother     Social History Social History   Tobacco Use   Smoking status: Never  Substance Use Topics   Alcohol use: No    Alcohol/week: 0.0 standard drinks of alcohol   Drug use: No     Allergies   Patient has no known allergies.   Review of Systems Review of Systems  Constitutional:  Negative for fatigue and fever.  HENT:  Positive for congestion, postnasal drip, rhinorrhea and sore throat.   Respiratory:  Positive for cough, shortness of breath and wheezing.   Cardiovascular:  Negative for chest pain.  Gastrointestinal:  Negative for abdominal pain, diarrhea, nausea and vomiting.  Physical Exam Triage Vital Signs ED Triage Vitals [06/22/23 0921]  Encounter Vitals Group     BP (!) 196/133     Systolic BP Percentile      Diastolic BP Percentile      Pulse Rate 93     Resp 16     Temp 98.6 F (37 C)     Temp Source Oral     SpO2 98 %     Weight      Height      Head Circumference      Peak Flow      Pain Score      Pain Loc      Pain Education      Exclude from Growth Chart    No data found.  Updated Vital Signs BP (!) 196/133 (BP Location: Left Arm) Comment: 193/136  Pulse 93   Temp 98.6 F (37 C) (Oral)   Resp 16   SpO2 98%   Visual Acuity Right Eye Distance:   Left Eye Distance:   Bilateral Distance:    Right Eye Near:   Left Eye Near:    Bilateral Near:     Physical Exam Vitals and nursing note reviewed.  Constitutional:      Appearance: Normal appearance.  HENT:     Head:  Normocephalic and atraumatic.     Right Ear: External ear normal.     Left Ear: External ear normal.     Nose: Congestion and rhinorrhea present.     Mouth/Throat:     Mouth: Mucous membranes are moist.     Pharynx: Posterior oropharyngeal erythema present.  Eyes:     Extraocular Movements: Extraocular movements intact.     Conjunctiva/sclera: Conjunctivae normal.     Pupils: Pupils are equal, round, and reactive to light.  Cardiovascular:     Rate and Rhythm: Normal rate and regular rhythm.     Heart sounds: Normal heart sounds. No murmur heard. Pulmonary:     Effort: Pulmonary effort is normal. No respiratory distress.     Breath sounds: Normal breath sounds.  Musculoskeletal:        General: Normal range of motion.     Cervical back: Normal range of motion.  Skin:    General: Skin is warm and dry.  Neurological:     General: No focal deficit present.     Mental Status: She is alert and oriented to person, place, and time.  Psychiatric:        Mood and Affect: Mood normal.        Behavior: Behavior normal.      UC Treatments / Results  Labs (all labs ordered are listed, but only abnormal results are displayed) Labs Reviewed  SARS CORONAVIRUS 2 (TAT 6-24 HRS)    EKG   Radiology No results found.  Procedures Procedures (including critical care time)  Medications Ordered in UC Medications - No data to display  Initial Impression / Assessment and Plan / UC Course  I have reviewed the triage vital signs and the nursing notes.  Pertinent labs & imaging results that were available during my care of the patient were reviewed by me and considered in my medical decision making (see chart for details).  Vitals and triage reviewed, patient is hemodynamically stable.  Hypertensive in clinic with history of same, not on any blood pressure medications at this time. No current PCP. BP 188/132 on re-check.  Without vision changes, severe headache, or signs or symptoms of  hypertensive emergency.  Lungs are vesicular posteriorly.  Posterior pharynx with slight erythema and postnasal drip.  Chest x-ray with no acute abnormalities, awaiting official radiology over-read. Suspect viral etiology, Covid-19 swab obtained. Symptomatic management discussed. POC, f/u care and return precautions given, no questions at this time.      Final Clinical Impressions(s) / UC Diagnoses   Final diagnoses:  Viral URI with cough  Essential hypertension     Discharge Instructions      Le hemos hecho la prueba de COVID-19 y nos pondremos en contacto con usted si los resultados son positivos. Mientras tanto, descanse, beba al menos 64 onzas de agua al da y alterne entre Tylenol e ibuprofeno cada 4 a 6 horas para la fiebre, los dolores y las molestias corporales. Para la tos, puede tomar jarabe para la tos, ya que puede causar somnolencia. No beba ni conduzca mientras est tomando [employer]. Tambin puede tomar t con miel, hacer grgaras con solucin salina tibia y dormir con un humidificador.  Su presin arterial aument significativamente hoy en la clnica. La hipertensin no tratada puede provocar muchas complicaciones, como hemorragias cerebrales, coma y [place]. Le he enviado un suministro de [address]; [employer] con un mdico de atencin primaria para que le renueve este medicamento para un mayor control de su hipertensin.  Regrese a la clnica si tiene sntomas nuevos o urgentes.  We have tested you for COVID-19 and we will contact you if the results are positive.  In the meantime, please rest, drink at least 64 ounces of water daily, and alternate between Tylenol and ibuprofen every 4-6 hours for any fever, body aches and pain.  For your cough you can take the cough syrup, this may cause drowsiness.  Do not drink or drive on this medication.  You can also do tea with honey, warm saline gargles and sleep with a humidifier.  Your blood pressure was significantly elevated  today in clinic.  Untreated hypertension can lead to many complications such as brain bleeds, coma and death.  I have sent you in 30-day supply, please follow-up with a primary care provider to get refills of this medication for further management of your hypertension.  Return to clinic for new or urgent symptoms.     ED Prescriptions     Medication Sig Dispense Auth. Provider   amLODipine (NORVASC) 5 MG tablet Take 1 tablet (5 mg total) by mouth daily. 30 tablet Rinaldo Ratel, [place] N, [place]   promethazine-dextromethorphan (PROMETHAZINE-DM) 6.25-15 MG/5ML syrup Take 5 mLs by mouth 4 (four) times daily as needed for cough. 118 mL Alima Naser, [place], [place]      PDMP not reviewed this encounter.   Gurveer Colucci, [place] N, [place] 06/22/23 1057

## 2023-06-22 NOTE — Discharge Instructions (Signed)
Le hemos hecho la prueba de COVID-19 y nos pondremos en contacto con usted si los [place] son positivos. Mientras tanto, descanse, beba al menos 64 onzas de agua al da y alterne entre Tylenol e ibuprofeno cada 4 a 6 horas para la fiebre, los dolores y las molestias corporales. Para la tos, puede tomar jarabe para la tos, ya que puede causar somnolencia. No beba ni conduzca mientras est tomando [employer]. Tambin puede tomar t con miel, hacer grgaras con solucin salina tibia y dormir con un humidificador.  Su presin arterial aument significativamente hoy en la clnica. La hipertensin no tratada puede provocar muchas complicaciones, como hemorragias cerebrales, coma y [place]. Le he enviado un suministro de [address]; [employer] con un mdico de atencin primaria para que le renueve este medicamento para un mayor control de su hipertensin.  Regrese a la clnica si tiene sntomas nuevos o urgentes.  We have tested you for COVID-19 and we will contact you if the results are positive.  In the meantime, please rest, drink at least 64 ounces of water daily, and alternate between Tylenol and ibuprofen every 4-6 hours for any fever, body aches and pain.  For your cough you can take the cough syrup, this may cause drowsiness.  Do not drink or drive on this medication.  You can also do tea with honey, warm saline gargles and sleep with a humidifier.  Your blood pressure was significantly elevated today in clinic.  Untreated hypertension can lead to many complications such as brain bleeds, coma and death.  I have sent you in 30-day supply, please follow-up with a primary care provider to get refills of this medication for further management of your hypertension.  Return to clinic for new or urgent symptoms.

## 2023-06-22 NOTE — ED Triage Notes (Signed)
Pt reports stated with cough and congestion last night. Pt states SOB started last night. Pt denies taking any medication for her blood pressure as it is elevated. Pt states she has a history of hypertension.

## 2023-06-23 LAB — SARS CORONAVIRUS 2 (TAT 6-24 HRS): SARS Coronavirus 2: NEGATIVE

## 2024-06-13 ENCOUNTER — Emergency Department (HOSPITAL_COMMUNITY)
Admission: EM | Admit: 2024-06-13 | Discharge: 2024-06-13 | Disposition: A | Discharge status: Home / Self Care | Attending: Emergency Medicine | Admitting: Emergency Medicine

## 2024-06-13 ENCOUNTER — Emergency Department (HOSPITAL_COMMUNITY)

## 2024-06-13 ENCOUNTER — Other Ambulatory Visit: Payer: Self-pay

## 2024-06-13 DIAGNOSIS — R930 Abnormal findings on diagnostic imaging of skull and head, not elsewhere classified: Secondary | ICD-10-CM | POA: Insufficient documentation

## 2024-06-13 DIAGNOSIS — I10 Essential (primary) hypertension: Secondary | ICD-10-CM | POA: Insufficient documentation

## 2024-06-13 DIAGNOSIS — Z79899 Other long term (current) drug therapy: Secondary | ICD-10-CM | POA: Insufficient documentation

## 2024-06-13 DIAGNOSIS — R040 Epistaxis: Secondary | ICD-10-CM | POA: Insufficient documentation

## 2024-06-13 LAB — CBC
HCT: 33.7 % — ABNORMAL LOW (ref 36.0–46.0)
Hemoglobin: 10.4 g/dL — ABNORMAL LOW (ref 12.0–15.0)
MCH: 25.3 pg — ABNORMAL LOW (ref 26.0–34.0)
MCHC: 30.9 g/dL (ref 30.0–36.0)
MCV: 82 fL (ref 80.0–100.0)
Platelets: 272 K/uL (ref 150–400)
RBC: 4.11 MIL/uL (ref 3.87–5.11)
RDW: 16.5 % — ABNORMAL HIGH (ref 11.5–15.5)
WBC: 10.4 K/uL (ref 4.0–10.5)
nRBC: 0 % (ref 0.0–0.2)

## 2024-06-13 LAB — BASIC METABOLIC PANEL WITH GFR
Anion gap: 13 (ref 5–15)
BUN: 15 mg/dL (ref 6–20)
CO2: 22 mmol/L (ref 22–32)
Calcium: 8.2 mg/dL — ABNORMAL LOW (ref 8.9–10.3)
Chloride: 101 mmol/L (ref 98–111)
Creatinine, Ser: 0.83 mg/dL (ref 0.44–1.00)
GFR, Estimated: >60 mL/min (ref >60)
Glucose, Bld: 320 mg/dL — ABNORMAL HIGH (ref 70–99)
Potassium: 3.4 mmol/L — ABNORMAL LOW (ref 3.5–5.1)
Sodium: 136 mmol/L (ref 135–145)

## 2024-06-13 LAB — HCG, SERUM, QUALITATIVE: Preg, Serum: NEGATIVE

## 2024-06-13 MED ORDER — AMLODIPINE BESYLATE 5 MG PO TABS
5.0000 mg | ORAL_TABLET | Freq: Once | ORAL | Status: AC
  Administered 2024-06-13: 5 mg via ORAL
  Filled 2024-06-13: qty 1

## 2024-06-13 MED ORDER — TRANEXAMIC ACID 1000 MG/10ML IV SOLN
500.0000 mg | Freq: Once | INTRAVENOUS | Status: AC
  Administered 2024-06-13: 500 mg via TOPICAL
  Filled 2024-06-13: qty 10

## 2024-06-13 MED ORDER — OXYMETAZOLINE HCL 0.05 % NA SOLN
2.0000 | Freq: Once | NASAL | Status: AC
  Administered 2024-06-13: 2 via NASAL
  Filled 2024-06-13: qty 30

## 2024-06-13 MED ORDER — AMLODIPINE BESYLATE 5 MG PO TABS: 5.0000 mg | ORAL_TABLET | Freq: Every day | ORAL | 0 refills | Status: AC

## 2024-06-13 MED ORDER — HYDRALAZINE HCL 20 MG/ML IJ SOLN
5.0000 mg | Freq: Once | INTRAMUSCULAR | Status: AC
  Administered 2024-06-13: 5 mg via INTRAVENOUS
  Filled 2024-06-13: qty 1

## 2024-06-13 NOTE — Discharge Instructions (Addendum)
 Evaluation today revealed that your blood pressure was considerably elevated.  I am restarting you on amlodipine .  Please take that daily and follow-up with your PCP.  For your nosebleed, please follow-up with ENT for reevaluation and removal of the Rhino Rocket if indicated.  For the finding on your head CT please follow-up with neurology.  If you develop any weakness or numbness in your extremities, facial droop, slurred speech, persistent nasal bleeding, shortness of breath or chest pain, or any other concerning symptom please return to the ED for further evaluation.

## 2024-06-13 NOTE — ED Triage Notes (Signed)
 Pt has had nosebleed for 1600. This has been going on and off since Tuesday but today bleeding wont stop. Pt bp elevated as well. Pt does have hx of htn but does not take meds.

## 2024-06-13 NOTE — ED Provider Notes (Signed)
 [place] EMERGENCY DEPARTMENT AT [facility] Provider Note   CSN: [id number] Arrival date & time: 06/13/24  9864     Patient presents with: Epistaxis  HPI Alison Webb is a 48 y.o. female presenting for nosebleed.  She states it has been intermittent since Tuesday but started again around 4 today and has been considerably worse and has not stopped.  She states the bleeding on Tuesday she noticed initially started in the right nare but she has noticed bleeding in both nares.  She also reports that she has not been taking blood pressure medications for several years.  Also mention she is having a headache but no visual disturbance, nuchal rigidity or fever.  Past Medical History:  Diagnosis Date   Gestational diabetes    diet controlled   Hypertension           Epistaxis      Prior to Admission medications   Medication Sig Start Date End Date Taking? Authorizing Provider  amLODipine  (NORVASC ) 5 MG tablet Take 1 tablet (5 mg total) by mouth daily. 06/13/24   Jerah Esty K, PA-C  benzonatate  (TESSALON ) 100 MG capsule Take 1 capsule (100 mg total) by mouth every 8 (eight) hours. 10/16/22   Wedderburn, Ngozi N, NP  predniSONE  (DELTASONE ) 20 MG tablet Take 2 tablets (40 mg total) by mouth daily. 10/16/22   Wedderburn, Ngozi N, NP  promethazine -dextromethorphan (PROMETHAZINE -DM) 6.25-15 MG/5ML syrup Take 5 mLs by mouth 4 (four) times daily as needed for cough. 06/22/23   Dreama Littie SAILOR, FNP    Allergies: Patient has no known allergies.    Review of Systems  HENT:  Positive for nosebleeds.     Physical Exam   Vitals:   06/13/24 0424 06/13/24 0504  BP: (!) 204/119 (!) 186/97  Pulse:  85  Resp:  19  Temp:    SpO2:  99%    CONSTITUTIONAL:  well-appearing, NAD NEURO:  Alert and oriented x 3, CN 3-12 grossly intact EYES:  eyes equal and reactive ENT/NECK:  Supple, no stridor, bright red blood noted in both nares. R>L.  No active bleeding noted.   Scant blood in the posterior oropharynx but otherwise grossly normal. Spitting up blood.  CARDIO:  Regular rate and rhythm, appears well-perfused PULM:  No respiratory distress, CTAB GI/GU:  non-distended MSK/SPINE:  No gross deformities, no edema, moves all extremities  SKIN:  no rash, atraumatic  *Additional and/or pertinent findings included in MDM below  (all labs ordered are listed, but only abnormal results are displayed) Labs Reviewed  CBC - Abnormal; Notable for the following components:      Result Value   Hemoglobin 10.4 (*)    HCT 33.7 (*)    MCH 25.3 (*)    RDW 16.5 (*)    All other components within normal limits  BASIC METABOLIC PANEL WITH GFR - Abnormal; Notable for the following components:   Potassium 3.4 (*)    Glucose, Bld 320 (*)    Calcium  8.2 (*)    All other components within normal limits  HCG, SERUM, QUALITATIVE    EKG: None  Radiology: CT Head Wo Contrast Result Date: 06/13/2024 CLINICAL DATA:  Headache, increasing frequency or severity EXAM: CT HEAD WITHOUT CONTRAST TECHNIQUE: Contiguous axial images were obtained from the base of the skull through the vertex without intravenous contrast. RADIATION DOSE REDUCTION: This exam was performed according to the departmental dose-optimization program which includes automated exposure control, adjustment of the mA and/or kV  according to patient size and/or use of iterative reconstruction technique. COMPARISON:  None Available. FINDINGS: Brain: No evidence of large-territorial acute infarction. No parenchymal hemorrhage. No mass lesion. No extra-axial collection. No mass effect or midline shift. No hydrocephalus. Basilar cisterns are patent. Partial empty sella. Vascular: No hyperdense vessel. Skull: No acute fracture or focal lesion. Sinuses/Orbits: Bilateral maxillary sinus mucosal thickening. Otherwise paranasal sinuses and mastoid air cells are clear. The orbits are unremarkable. Other: None. IMPRESSION: 1. No acute  intracranial abnormality. 2. Partial empty sella. Findings is often a normal anatomic variant but can be associated with idiopathic intracranial hypertension (pseudotumor cerebri). Electronically Signed   By: Morgane  Naveau M.D.   On: 06/13/2024 03:25     Epistaxis Management  Date/Time: 06/13/2024 5:40 AM  Performed by: Lang Norleen POUR, PA-C Authorized by: Lang Norleen POUR, PA-C   Consent:    Consent obtained:  Verbal   Consent given by:  Patient   Risks, benefits, and alternatives were discussed: yes     Risks discussed:  Bleeding and infection   Alternatives discussed:  No treatment and delayed treatment Universal protocol:    Procedure explained and questions answered to patient or proxy's satisfaction: yes     Relevant documents present and verified: yes     Test results available: yes     Imaging studies available: yes     Patient identity confirmed:  Verbally with patient and arm band Anesthesia:    Anesthesia method:  None Procedure details:    Treatment site:  R anterior   Repair method: rhino rocket.   Treatment complexity:  Limited   Treatment episode: initial   Post-procedure details:    Assessment:  Bleeding stopped   Procedure completion:  Tolerated well, no immediate complications    Medications Ordered in the ED  oxymetazoline  (AFRIN) 0.05 % nasal spray 2 spray (2 sprays Each Nare Given 06/13/24 0233)  tranexamic acid  (CYKLOKAPRON ) injection 500 mg (500 mg Topical Given 06/13/24 0234)  hydrALAZINE  (APRESOLINE ) injection 5 mg (5 mg Intravenous Given 06/13/24 0424)  amLODipine  (NORVASC ) tablet 5 mg (5 mg Oral Given 06/13/24 0504)                                    Medical Decision Making Amount and/or Complexity of Data Reviewed Labs: ordered. Radiology: ordered.  Risk OTC drugs. Prescription drug management.   Initial Impression and Ddx 48 yo well appearing female presenting for epistaxis. Exam notable for bleeding in the nares, spitting up blood and  elevated blood pressure.  Initially gave Afrin and applied TXA gauze.  DDx includes anterior versus posterior nosebleed, intracranial bleed, hypertensive emergency, symptomatic anemia, other. Patient PMH that increases complexity of ED encounter: Poorly controlled hypertension  Interpretation of Diagnostics - I independent reviewed and interpreted the labs as followed: stable anemia  - I independently visualized the following imaging with scope of interpretation limited to determining acute life threatening conditions related to emergency care: CT head, which revealed  no acute findings but partial empty sella concerning for IIH. Share findings with patient.   - I have personally reviewed and interpreted EKG which revealed sinus rhythm  Patient Reassessment and Ultimate Disposition/Management On reassessment epistaxis was still persistent.  Proceeded to insert a [employer].  Hemostasis achieved.  Gave IV hydralazine  and started her on her home dose of amlodipine  and blood pressure did improve.  Workup does not suggest hypertensive emergency or  symptomatic anemia.  For findings on CT head advised her to follow-up with neurology.  Also advised her to follow-up with ENT for reevaluation and removal of the [employer].  Discussed return precautions.  Discharged in good condition.  Sent amlodipine  to her pharmacy.  Also advised her to follow-up with PCP for management of her blood pressure moving forward.  Patient management required discussion with the following services or consulting groups:  None  Complexity of Problems Addressed Acute complicated illness or Injury  Additional Data Reviewed and Analyzed Further history obtained from: Past medical history and medications listed in the EMR and Prior ED visit notes  Patient Encounter Risk Assessment Consideration of hospitalization      Final diagnoses:  Epistaxis  Hypertension, unspecified type  Abnormal head CT    ED Discharge  Orders          Ordered    amLODipine  (NORVASC ) 5 MG tablet  Daily        06/13/24 0542    Ambulatory referral to Neurology       Comments: An appointment is requested in approximately: 2 weeks   06/13/24 0543               Lang Norleen POUR, PA-C 06/13/24 0545    Mesner, Selinda, MD 06/13/24 9358    Lorette Selinda, MD 06/13/24 [phone]

## 2024-07-29 ENCOUNTER — Ambulatory Visit: Payer: Self-pay | Admitting: Diagnostic Neuroimaging
